# Patient Record
Sex: Female | Born: 1940 | Race: White | Hispanic: No | Smoking: Never smoker
Health system: Southern US, Community
[De-identification: ages and names within clinical notes are randomized; demographics above are authoritative.]

## PROBLEM LIST (undated history)

## (undated) DIAGNOSIS — N6009 Solitary cyst of unspecified breast: Secondary | ICD-10-CM

## (undated) DIAGNOSIS — I10 Essential (primary) hypertension: Secondary | ICD-10-CM

## (undated) DIAGNOSIS — M1990 Unspecified osteoarthritis, unspecified site: Secondary | ICD-10-CM

## (undated) DIAGNOSIS — E7800 Pure hypercholesterolemia, unspecified: Secondary | ICD-10-CM

## (undated) DIAGNOSIS — N879 Dysplasia of cervix uteri, unspecified: Secondary | ICD-10-CM

## (undated) DIAGNOSIS — M159 Polyosteoarthritis, unspecified: Secondary | ICD-10-CM

## (undated) DIAGNOSIS — E079 Disorder of thyroid, unspecified: Secondary | ICD-10-CM

## (undated) DIAGNOSIS — R350 Frequency of micturition: Secondary | ICD-10-CM

## (undated) DIAGNOSIS — E669 Obesity, unspecified: Secondary | ICD-10-CM

## (undated) HISTORY — DX: Disorder of thyroid, unspecified: E07.9

## (undated) HISTORY — DX: Polyosteoarthritis, unspecified: M15.9

## (undated) HISTORY — PX: CATARACT EXTRACTION: SUR2

## (undated) HISTORY — DX: Obesity, unspecified: E66.9

## (undated) HISTORY — DX: Solitary cyst of unspecified breast: N60.09

## (undated) HISTORY — DX: Frequency of micturition: R35.0

## (undated) HISTORY — DX: Pure hypercholesterolemia, unspecified: E78.00

## (undated) HISTORY — DX: Dysplasia of cervix uteri, unspecified: N87.9

---

## 1999-07-24 ENCOUNTER — Other Ambulatory Visit: Admission: RE | Admit: 1999-07-24 | Discharge: 1999-07-24 | Payer: Self-pay | Admitting: Obstetrics and Gynecology

## 2001-12-19 ENCOUNTER — Other Ambulatory Visit: Admission: RE | Admit: 2001-12-19 | Discharge: 2001-12-19 | Payer: Self-pay | Admitting: Obstetrics and Gynecology

## 2004-12-20 ENCOUNTER — Other Ambulatory Visit: Admission: RE | Admit: 2004-12-20 | Discharge: 2004-12-20 | Payer: Self-pay | Admitting: Obstetrics and Gynecology

## 2005-03-02 ENCOUNTER — Ambulatory Visit (HOSPITAL_COMMUNITY): Admission: RE | Admit: 2005-03-02 | Discharge: 2005-03-02 | Payer: Self-pay | Admitting: Gastroenterology

## 2005-03-02 ENCOUNTER — Encounter (INDEPENDENT_AMBULATORY_CARE_PROVIDER_SITE_OTHER): Payer: Self-pay | Admitting: *Deleted

## 2006-05-13 ENCOUNTER — Encounter: Payer: Self-pay | Admitting: Internal Medicine

## 2006-05-13 LAB — CONVERTED CEMR LAB

## 2006-05-22 ENCOUNTER — Other Ambulatory Visit: Admission: RE | Admit: 2006-05-22 | Discharge: 2006-05-22 | Payer: Self-pay | Admitting: Obstetrics and Gynecology

## 2006-07-08 ENCOUNTER — Ambulatory Visit: Payer: Self-pay | Admitting: Internal Medicine

## 2006-07-08 LAB — CONVERTED CEMR LAB
BUN: 8 mg/dL (ref 6–23)
Basophils Absolute: 0 10*3/uL (ref 0.0–0.1)
Calcium: 9.5 mg/dL (ref 8.4–10.5)
Chloride: 105 meq/L (ref 96–112)
Cholesterol: 232 mg/dL (ref 0–200)
Creatinine, Ser: 0.6 mg/dL (ref 0.4–1.2)
Direct LDL: 155.8 mg/dL
Hgb A1c MFr Bld: 10.3 % — ABNORMAL HIGH (ref 4.6–6.0)
MCHC: 34.4 g/dL (ref 30.0–36.0)
Monocytes Relative: 6.7 % (ref 3.0–11.0)
RBC: 4.48 M/uL (ref 3.87–5.11)
RDW: 12.6 % (ref 11.5–14.6)
TSH: 9.47 microintl units/mL — ABNORMAL HIGH (ref 0.35–5.50)
Total CHOL/HDL Ratio: 5.5
Triglycerides: 132 mg/dL (ref 0–149)
VLDL: 26 mg/dL (ref 0–40)

## 2006-08-16 ENCOUNTER — Ambulatory Visit: Payer: Self-pay | Admitting: Internal Medicine

## 2006-08-16 LAB — CONVERTED CEMR LAB
BUN: 11 mg/dL (ref 6–23)
CO2: 29 meq/L (ref 19–32)
Cholesterol: 248 mg/dL (ref 0–200)
Creatinine, Ser: 0.5 mg/dL (ref 0.4–1.2)
Direct LDL: 166.7 mg/dL
HDL: 47.6 mg/dL (ref >39.0)
Potassium: 4 meq/L (ref 3.5–5.1)

## 2006-10-16 ENCOUNTER — Ambulatory Visit: Payer: Self-pay | Admitting: Internal Medicine

## 2006-10-16 LAB — CONVERTED CEMR LAB
Calcium: 9.5 mg/dL (ref 8.4–10.5)
GFR calc Af Amer: 159 mL/min
GFR calc non Af Amer: 132 mL/min
Glucose, Bld: 120 mg/dL — ABNORMAL HIGH (ref 70–99)
Sodium: 143 meq/L (ref 135–145)
Total CHOL/HDL Ratio: 4.4
Triglycerides: 116 mg/dL (ref 0–149)

## 2006-10-28 ENCOUNTER — Ambulatory Visit: Payer: Self-pay | Admitting: Family Medicine

## 2006-10-31 ENCOUNTER — Ambulatory Visit: Payer: Self-pay | Admitting: Internal Medicine

## 2006-11-11 ENCOUNTER — Encounter: Admission: RE | Admit: 2006-11-11 | Discharge: 2006-11-20 | Payer: Self-pay | Admitting: Internal Medicine

## 2007-01-29 ENCOUNTER — Encounter: Payer: Self-pay | Admitting: Internal Medicine

## 2007-01-29 DIAGNOSIS — Z860100 Personal history of colon polyps, unspecified: Secondary | ICD-10-CM | POA: Insufficient documentation

## 2007-01-29 DIAGNOSIS — I10 Essential (primary) hypertension: Secondary | ICD-10-CM

## 2007-01-29 DIAGNOSIS — E119 Type 2 diabetes mellitus without complications: Secondary | ICD-10-CM | POA: Insufficient documentation

## 2007-01-29 DIAGNOSIS — E785 Hyperlipidemia, unspecified: Secondary | ICD-10-CM

## 2007-01-29 DIAGNOSIS — Z8719 Personal history of other diseases of the digestive system: Secondary | ICD-10-CM | POA: Insufficient documentation

## 2007-05-29 ENCOUNTER — Other Ambulatory Visit: Admission: RE | Admit: 2007-05-29 | Discharge: 2007-05-29 | Payer: Self-pay | Admitting: Obstetrics and Gynecology

## 2007-09-16 ENCOUNTER — Emergency Department (HOSPITAL_COMMUNITY): Admission: EM | Admit: 2007-09-16 | Discharge: 2007-09-16 | Payer: Self-pay | Admitting: Family Medicine

## 2007-09-23 ENCOUNTER — Emergency Department (HOSPITAL_COMMUNITY): Admission: EM | Admit: 2007-09-23 | Discharge: 2007-09-23 | Payer: Self-pay | Admitting: Family Medicine

## 2007-09-26 ENCOUNTER — Emergency Department (HOSPITAL_COMMUNITY): Admission: EM | Admit: 2007-09-26 | Discharge: 2007-09-26 | Payer: Self-pay | Admitting: Emergency Medicine

## 2008-05-31 ENCOUNTER — Ambulatory Visit: Payer: Self-pay | Admitting: Obstetrics and Gynecology

## 2008-05-31 ENCOUNTER — Other Ambulatory Visit: Admission: RE | Admit: 2008-05-31 | Discharge: 2008-05-31 | Payer: Self-pay | Admitting: Obstetrics and Gynecology

## 2008-11-18 ENCOUNTER — Emergency Department (HOSPITAL_COMMUNITY): Admission: EM | Admit: 2008-11-18 | Discharge: 2008-11-18 | Payer: Self-pay | Admitting: Emergency Medicine

## 2009-01-04 ENCOUNTER — Ambulatory Visit: Payer: Self-pay | Admitting: Obstetrics and Gynecology

## 2009-06-08 ENCOUNTER — Other Ambulatory Visit: Admission: RE | Admit: 2009-06-08 | Discharge: 2009-06-08 | Payer: Self-pay | Admitting: Obstetrics and Gynecology

## 2009-06-08 ENCOUNTER — Ambulatory Visit: Payer: Self-pay | Admitting: Obstetrics and Gynecology

## 2009-06-23 ENCOUNTER — Ambulatory Visit: Payer: Self-pay | Admitting: Obstetrics and Gynecology

## 2010-06-19 ENCOUNTER — Ambulatory Visit
Admission: RE | Admit: 2010-06-19 | Discharge: 2010-06-19 | Payer: Self-pay | Source: Home / Self Care | Attending: Obstetrics and Gynecology | Admitting: Obstetrics and Gynecology

## 2010-07-19 ENCOUNTER — Ambulatory Visit (INDEPENDENT_AMBULATORY_CARE_PROVIDER_SITE_OTHER): Payer: Medicare Other

## 2010-07-19 ENCOUNTER — Inpatient Hospital Stay (INDEPENDENT_AMBULATORY_CARE_PROVIDER_SITE_OTHER)
Admission: RE | Admit: 2010-07-19 | Discharge: 2010-07-19 | Disposition: A | Discharge status: Home / Self Care | Payer: Medicare Other | Source: Ambulatory Visit | Attending: Family Medicine | Admitting: Family Medicine

## 2010-07-19 DIAGNOSIS — J40 Bronchitis, not specified as acute or chronic: Secondary | ICD-10-CM

## 2010-09-18 LAB — WET PREP, GENITAL
Clue Cells Wet Prep HPF POC: NONE SEEN
Trich, Wet Prep: NONE SEEN
Yeast Wet Prep HPF POC: NONE SEEN

## 2010-09-18 LAB — POCT URINALYSIS DIP (DEVICE)
Hgb urine dipstick: NEGATIVE
Protein, ur: NEGATIVE mg/dL
Specific Gravity, Urine: <=1.005 (ref 1.005–1.030)
Urobilinogen, UA: 0.2 mg/dL (ref 0.0–1.0)
pH: 6.5 (ref 5.0–8.0)

## 2010-10-27 NOTE — Op Note (Signed)
NAMEMARGIA, WIESEN NO.:  [id number]   MEDICAL RECORD NO.:  [id number]          PATIENT TYPE:  AMB   LOCATION:  ENDO                         FACILITY:  [facility]   PHYSICIAN:  Graylin Shiver, M.D.   DATE OF BIRTH:  06/30/1940   DATE OF PROCEDURE:  03/02/2005  DATE OF DISCHARGE:                                 OPERATIVE REPORT   PROCEDURE:  Colonoscopy with polypectomy.   ENDOSCOPIST:  Graylin Shiver, M.D.   INDICATION:  Heme-positive stool.   Informed consent was obtained after explanation of the risks of bleeding,  infection and perforation.   PREMEDICATION:  Fentanyl 75 mcg IV, Versed 6 mg IV.   PROCEDURE:  With the patient in the left lateral decubitus position, a  rectal exam was performed; no masses were felt.  The Olympus colonoscope was  inserted into the rectum and advanced around colon to the cecum.  Cecal  landmarks were identified with identification of the appendiceal orifice and  ileocecal valve.  In the cecum, there was a 5-mm sessile polyp; this was  snared with a mini-snare and removed by snare cautery technique.  It was  suctioned through the scope into the specimen container.  The ascending  colon looked normal.  The transverse colon showed few scattered diverticula.  The descending colon and sigmoid colon showed diverticulosis.  The rectum  looked normal.  The prep was good.  She tolerated the procedure well without  complications.   IMPRESSION:  1.  Cecal polyp.  2.  Diverticulosis.   PLAN:  The pathology will be checked.           ______________________________  Graylin Shiver, M.D.     SFG/MEDQ  D:  03/02/2005  T:  03/03/2005  Job:  [id number]   cc:   Reuel Boom L. Eda Paschal, M.D.  Fax: [phone]

## 2011-01-27 ENCOUNTER — Inpatient Hospital Stay (INDEPENDENT_AMBULATORY_CARE_PROVIDER_SITE_OTHER)
Admission: RE | Admit: 2011-01-27 | Discharge: 2011-01-27 | Disposition: A | Discharge status: Home / Self Care | Payer: Medicare Other | Source: Ambulatory Visit | Attending: Family Medicine | Admitting: Family Medicine

## 2011-01-27 DIAGNOSIS — IMO0002 Reserved for concepts with insufficient information to code with codable children: Secondary | ICD-10-CM

## 2011-01-27 LAB — POCT URINALYSIS DIP (DEVICE)
Bilirubin Urine: NEGATIVE
Glucose, UA: NEGATIVE mg/dL
Nitrite: NEGATIVE

## 2011-01-30 LAB — URINE CULTURE: Culture  Setup Time: 201208191126

## 2011-05-17 ENCOUNTER — Other Ambulatory Visit: Payer: Self-pay | Admitting: *Deleted

## 2011-05-17 DIAGNOSIS — N63 Unspecified lump in unspecified breast: Secondary | ICD-10-CM

## 2011-07-02 ENCOUNTER — Other Ambulatory Visit: Payer: Self-pay | Admitting: Obstetrics and Gynecology

## 2011-07-02 DIAGNOSIS — N63 Unspecified lump in unspecified breast: Secondary | ICD-10-CM

## 2011-08-07 ENCOUNTER — Encounter: Payer: Self-pay | Admitting: Obstetrics and Gynecology

## 2011-08-07 ENCOUNTER — Ambulatory Visit (INDEPENDENT_AMBULATORY_CARE_PROVIDER_SITE_OTHER): Payer: Medicare Other | Admitting: Obstetrics and Gynecology

## 2011-08-07 ENCOUNTER — Other Ambulatory Visit (HOSPITAL_COMMUNITY)
Admission: RE | Admit: 2011-08-07 | Discharge: 2011-08-07 | Disposition: A | Discharge status: Home / Self Care | Payer: Medicare Other | Source: Ambulatory Visit | Attending: Obstetrics and Gynecology | Admitting: Obstetrics and Gynecology

## 2011-08-07 VITALS — BP 140/80 | Ht 64.5 in | Wt 205.0 lb

## 2011-08-07 DIAGNOSIS — N952 Postmenopausal atrophic vaginitis: Secondary | ICD-10-CM

## 2011-08-07 DIAGNOSIS — Z124 Encounter for screening for malignant neoplasm of cervix: Secondary | ICD-10-CM

## 2011-08-07 DIAGNOSIS — E7800 Pure hypercholesterolemia, unspecified: Secondary | ICD-10-CM | POA: Insufficient documentation

## 2011-08-07 DIAGNOSIS — N393 Stress incontinence (female) (male): Secondary | ICD-10-CM

## 2011-08-07 DIAGNOSIS — E079 Disorder of thyroid, unspecified: Secondary | ICD-10-CM | POA: Insufficient documentation

## 2011-08-07 DIAGNOSIS — N390 Urinary tract infection, site not specified: Secondary | ICD-10-CM

## 2011-08-07 DIAGNOSIS — N6009 Solitary cyst of unspecified breast: Secondary | ICD-10-CM | POA: Insufficient documentation

## 2011-08-07 DIAGNOSIS — R350 Frequency of micturition: Secondary | ICD-10-CM | POA: Insufficient documentation

## 2011-08-07 NOTE — Progress Notes (Signed)
The patient came back to see me today for further followup. We have placed her on vaginal estrogen for atrophic vaginitis. She is less dry now. Since she has been on it her urinary stress incontinence has resolved. In addition she is doing Kegel exercises. She is having no vaginal bleeding. She is having no pelvic pain. She has however had 3 UTIs. She is not sexually active. The breast cyst found on mammogram one year ago is now resolved on recent mammogram. Mammogram was otherwise normal. She is currently having no dysuria. She will have occasional nocturia. Patient has had 2 normal bone densities.  ROS: 12 system review done. Pertinent positives above. Other positives include diabetes, hypertension, hyperlipidemia, and diverticulitis.  Physical examination: Kennon Portela present. HEENT within normal limits. Neck: Thyroid not large. No masses. Supraclavicular nodes: not enlarged. Breasts: Examined in both sitting midline position. No skin changes and no masses. Abdomen: Soft no guarding rebound or masses or hernia. Pelvic: External: Within normal limits. BUS: Within normal limits. Vaginal:within normal limits. Good estrogen effect. No evidence of cystocele rectocele or enterocele. Cervix: clean. Uterus: Normal size and shape. Adnexa: No masses. Rectovaginal exam: Confirmatory and negative. Extremities: Within normal limits.  Assessment: #1. Atrophic vaginitis #2. Urinary tract infections #3. Breast cyst #4. Urinary stress incontinence  Plan: Continue yearly mammograms. urological referral if UTIs continue. Continue estradiol vaginal cream to 0.02%.

## 2014-03-18 LAB — CBC AND DIFFERENTIAL
HCT: 37 % (ref 36–46)
Hemoglobin: 12.1 g/dL (ref 12.0–16.0)
Platelets: 264 10*3/uL (ref 150–399)
WBC: 5.7 10*3/mL

## 2014-04-12 ENCOUNTER — Encounter: Payer: Self-pay | Admitting: Obstetrics and Gynecology

## 2014-05-04 ENCOUNTER — Ambulatory Visit
Admission: RE | Admit: 2014-05-04 | Discharge: 2014-05-04 | Disposition: A | Discharge status: Home / Self Care | Payer: Medicare Other | Source: Ambulatory Visit | Attending: Family Medicine | Admitting: Family Medicine

## 2014-05-04 ENCOUNTER — Other Ambulatory Visit: Payer: Self-pay | Admitting: Family Medicine

## 2014-05-04 DIAGNOSIS — M25561 Pain in right knee: Secondary | ICD-10-CM

## 2014-05-04 DIAGNOSIS — R52 Pain, unspecified: Secondary | ICD-10-CM

## 2014-07-07 ENCOUNTER — Encounter (INDEPENDENT_AMBULATORY_CARE_PROVIDER_SITE_OTHER): Payer: Self-pay | Admitting: Ophthalmology

## 2015-03-08 LAB — BASIC METABOLIC PANEL
BUN: 12 mg/dL (ref 4–21)
Creatinine: 0.6 mg/dL (ref <=1.1)
GLUCOSE: 144 mg/dL
Potassium: 4.5 mmol/L (ref 3.4–5.3)
Sodium: 138 mmol/L (ref 137–147)

## 2015-03-08 LAB — HEMOGLOBIN A1C: HEMOGLOBIN A1C: 7.7

## 2015-03-08 LAB — LIPID PANEL
Cholesterol: 203 mg/dL — AB (ref 0–200)
HDL: 44 mg/dL (ref 35–70)
LDL CALC: 123 mg/dL
TRIGLYCERIDES: 179 mg/dL — AB (ref 40–160)

## 2015-03-08 LAB — TSH: TSH: 0.29 u[IU]/mL — AB (ref <=5.90)

## 2015-06-06 ENCOUNTER — Emergency Department (HOSPITAL_BASED_OUTPATIENT_CLINIC_OR_DEPARTMENT_OTHER)
Admission: EM | Admit: 2015-06-06 | Discharge: 2015-06-07 | Disposition: A | Discharge status: Home / Self Care | Payer: Medicare Other | Attending: Emergency Medicine | Admitting: Emergency Medicine

## 2015-06-06 ENCOUNTER — Encounter (HOSPITAL_BASED_OUTPATIENT_CLINIC_OR_DEPARTMENT_OTHER): Payer: Self-pay | Admitting: *Deleted

## 2015-06-06 DIAGNOSIS — I10 Essential (primary) hypertension: Secondary | ICD-10-CM | POA: Insufficient documentation

## 2015-06-06 DIAGNOSIS — R739 Hyperglycemia, unspecified: Secondary | ICD-10-CM

## 2015-06-06 DIAGNOSIS — E079 Disorder of thyroid, unspecified: Secondary | ICD-10-CM | POA: Insufficient documentation

## 2015-06-06 DIAGNOSIS — E7800 Pure hypercholesterolemia, unspecified: Secondary | ICD-10-CM | POA: Insufficient documentation

## 2015-06-06 DIAGNOSIS — Z87448 Personal history of other diseases of urinary system: Secondary | ICD-10-CM | POA: Diagnosis not present

## 2015-06-06 DIAGNOSIS — Z8742 Personal history of other diseases of the female genital tract: Secondary | ICD-10-CM | POA: Insufficient documentation

## 2015-06-06 DIAGNOSIS — E1165 Type 2 diabetes mellitus with hyperglycemia: Secondary | ICD-10-CM | POA: Diagnosis present

## 2015-06-06 DIAGNOSIS — Z8739 Personal history of other diseases of the musculoskeletal system and connective tissue: Secondary | ICD-10-CM | POA: Diagnosis not present

## 2015-06-06 DIAGNOSIS — Z79899 Other long term (current) drug therapy: Secondary | ICD-10-CM | POA: Insufficient documentation

## 2015-06-06 HISTORY — DX: Essential (primary) hypertension: I10

## 2015-06-06 HISTORY — DX: Unspecified osteoarthritis, unspecified site: M19.90

## 2015-06-06 LAB — COMPREHENSIVE METABOLIC PANEL
ALBUMIN: 4 g/dL (ref 3.5–5.0)
ALT: 22 U/L (ref 14–54)
AST: 21 U/L (ref 15–41)
Alkaline Phosphatase: 64 U/L (ref 38–126)
Anion gap: 7 (ref 5–15)
BUN: 17 mg/dL (ref 6–20)
CHLORIDE: 102 mmol/L (ref 101–111)
CO2: 25 mmol/L (ref 22–32)
CREATININE: 0.61 mg/dL (ref 0.44–1.00)
Calcium: 9.4 mg/dL (ref 8.9–10.3)
GFR calc Af Amer: >60 mL/min (ref >60)
GFR calc non Af Amer: >60 mL/min (ref >60)
GLUCOSE: 290 mg/dL — AB (ref 65–99)
Potassium: 4 mmol/L (ref 3.5–5.1)
SODIUM: 134 mmol/L — AB (ref 135–145)
Total Bilirubin: 0.4 mg/dL (ref 0.3–1.2)
Total Protein: 7.1 g/dL (ref 6.5–8.1)

## 2015-06-06 LAB — CBC
HCT: 35.9 % — ABNORMAL LOW (ref 36.0–46.0)
Hemoglobin: 11.9 g/dL — ABNORMAL LOW (ref 12.0–15.0)
MCH: 29 pg (ref 26.0–34.0)
MCHC: 33.1 g/dL (ref 30.0–36.0)
MCV: 87.3 fL (ref 78.0–100.0)
PLATELETS: 277 10*3/uL (ref 150–400)
RBC: 4.11 MIL/uL (ref 3.87–5.11)
RDW: 14 % (ref 11.5–15.5)
WBC: 10.6 10*3/uL — ABNORMAL HIGH (ref 4.0–10.5)

## 2015-06-06 LAB — CBG MONITORING, ED: Glucose-Capillary: 315 mg/dL — ABNORMAL HIGH (ref 65–99)

## 2015-06-06 LAB — URINALYSIS, ROUTINE W REFLEX MICROSCOPIC
BILIRUBIN URINE: NEGATIVE
Glucose, UA: >1000 mg/dL — AB
HGB URINE DIPSTICK: NEGATIVE
Ketones, ur: NEGATIVE mg/dL
Leukocytes, UA: NEGATIVE
Nitrite: NEGATIVE
PH: 6.5 (ref 5.0–8.0)
Protein, ur: NEGATIVE mg/dL
SPECIFIC GRAVITY, URINE: 1.03 (ref 1.005–1.030)

## 2015-06-06 LAB — URINE MICROSCOPIC-ADD ON: RBC / HPF: NONE SEEN RBC/hpf (ref 0–5)

## 2015-06-06 MED ORDER — INSULIN ASPART 100 UNIT/ML ~~LOC~~ SOLN
4.0000 [IU] | Freq: Once | SUBCUTANEOUS | Status: AC
  Administered 2015-06-06: 4 [IU] via SUBCUTANEOUS
  Filled 2015-06-06: qty 1

## 2015-06-06 MED ORDER — SODIUM CHLORIDE 0.9 % IV BOLUS (SEPSIS)
1000.0000 mL | Freq: Once | INTRAVENOUS | Status: AC
  Administered 2015-06-06: 1000 mL via INTRAVENOUS

## 2015-06-06 NOTE — ED Provider Notes (Signed)
CSN: [id number]     Arrival date & time 06/06/15  2032 History   First MD Initiated Contact with Patient 06/06/15 2232     Chief Complaint  Patient presents with  . Hyperglycemia     (Consider location/radiation/quality/duration/timing/severity/associated sxs/prior Treatment) HPI   Patient is a 74 year old female with past medical history of diabetes and hypothyroidism who presents to the ED with complaint of hyperglycemia. Patient states she has been on steroids for her shoulder and sore throat. She notes she was on a 5 day dose that was started on 12/15 however due to her shoulder pain persisting her doctor started her on another steroid dose on 12/22. She notes since starting the second dose of steroids she began having lightheadedness upon standing and notes her blood sugars have been running in the upper 300s at home. She states her blood sugars typically are in the 100s. Denies fever, chills, headache, visual changes, dizziness, cough, difficulty breathing, chest pain, abdominal pain, nausea, vomiting, diarrhea, urinary symptoms, numbness, tingling, weakness, syncope.  Past Medical History  Diagnosis Date  . Diabetes mellitus   . Cervical intraepithelial neoplasia (CIN)     CRYO  . Elevated cholesterol   . Breast cyst   . Frequent urination   . Thyroid disease     HYPOTHYROID  . Hypertension   . Arthritis    Past Surgical History  Procedure Laterality Date  . Cataract extraction     Family History  Problem Relation Age of Onset  . Hypertension Mother   . Heart disease Mother   . Diabetes Mother   . Hypertension Sister   . Hypertension Brother   . Heart disease Brother   . Diabetes Maternal Grandmother   . Heart disease Maternal Grandmother    Social History  Substance Use Topics  . Smoking status: Never Smoker   . Smokeless tobacco: Never Used  . Alcohol Use: No   OB History    Gravida Para Term Preterm AB TAB SAB Ectopic Multiple Living   0     2      Review of Systems  Neurological: Positive for light-headedness.  All other systems reviewed and are negative.     Allergies  Metformin  Home Medications   Prior to Admission medications   Medication Sig Start Date End Date Taking? Authorizing Provider  Alpha-Lipoic Acid 200 MG CAPS Take 400 mg by mouth 2 (two) times daily.   Yes Historical Provider, MD  ARMOUR THYROID PO Take 100 mcg by mouth.   Yes Historical Provider, MD  CALCIUM PO Take by mouth.   Yes Historical Provider, MD  cholecalciferol (VITAMIN D) 1000 UNITS tablet Take 5,000 Units by mouth daily.   Yes Historical Provider, MD  Chromium 500 MCG TABS Take by mouth.   Yes Historical Provider, MD  Cyanocobalamin (VITAMIN B 12 PO) Take by mouth daily.   Yes Historical Provider, MD  ESTRADIOL VA Place vaginally.   Yes Historical Provider, MD  glimepiride (AMARYL) 2 MG tablet Take 2 mg by mouth 2 (two) times daily.   Yes Historical Provider, MD  Omega-3 Fatty Acids (OMEGA 3 PO) Take by mouth.   Yes Historical Provider, MD  vitamin C (ASCORBIC ACID) 500 MG tablet Take 500 mg by mouth daily.   Yes Historical Provider, MD  vitamin E 400 UNIT capsule Take 400 Units by mouth daily.   Yes Historical Provider, MD  Zinc Citrate-Phytase 25-500 MG CAPS Take 50-1,000 mg by mouth daily.  Yes Historical Provider, MD  simvastatin (ZOCOR) 20 MG tablet Take 20 mg by mouth every evening.    Historical Provider, MD   BP 177/76 mmHg  Pulse 70  Temp(Src) 98.4 F (36.9 C) (Oral)  Resp 18  SpO2 98% Physical Exam  Constitutional: She is oriented to person, place, and time. She appears well-developed and well-nourished. No distress.  HENT:  Head: Normocephalic and atraumatic.  Mouth/Throat: Oropharynx is clear and moist. No oropharyngeal exudate.  Eyes: Conjunctivae and EOM are normal. Pupils are equal, round, and reactive to light. Right eye exhibits no discharge. Left eye exhibits no discharge. No scleral icterus.  Neck: Normal range of  motion. Neck supple.  Cardiovascular: Normal rate, regular rhythm, normal heart sounds and intact distal pulses.   Pulmonary/Chest: Effort normal and breath sounds normal. No respiratory distress. She has no wheezes. She has no rales. She exhibits no tenderness.  Abdominal: Soft. Bowel sounds are normal. She exhibits no distension and no mass. There is no tenderness. There is no rebound and no guarding.  Musculoskeletal: She exhibits no edema.  Lymphadenopathy:    She has no cervical adenopathy.  Neurological: She is alert and oriented to person, place, and time. She has normal strength. No cranial nerve deficit or sensory deficit. Coordination and gait normal.  Skin: Skin is warm and dry.  Nursing note and vitals reviewed.   ED Course  Procedures (including critical care time) Labs Review Labs Reviewed  URINALYSIS, ROUTINE W REFLEX MICROSCOPIC (NOT AT [facility]) - Abnormal; Notable for the following:    Glucose, UA >1000 (*)    All other components within normal limits  URINE MICROSCOPIC-ADD ON - Abnormal; Notable for the following:    Squamous Epithelial / LPF 0-5 (*)    Bacteria, UA FEW (*)    All other components within normal limits  COMPREHENSIVE METABOLIC PANEL - Abnormal; Notable for the following:    Sodium 134 (*)    Glucose, Bld 290 (*)    All other components within normal limits  CBC - Abnormal; Notable for the following:    WBC 10.6 (*)    Hemoglobin 11.9 (*)    HCT 35.9 (*)    All other components within normal limits  CBG MONITORING, ED - Abnormal; Notable for the following:    Glucose-Capillary 315 (*)    All other components within normal limits  CBG MONITORING, ED - Abnormal; Notable for the following:    Glucose-Capillary 204 (*)    All other components within normal limits  CBG MONITORING, ED    Imaging Review No results found. I have personally reviewed and evaluated these images and lab results as part of my medical decision-making.  Filed Vitals:    06/06/15 2041 06/06/15 2340  BP: 180/72 177/76  Pulse: 72 70  Temp: 98.4 F (36.9 C)   Resp: 18 18     MDM   Final diagnoses:  Hyperglycemia    Patient presents with hyperglycemia. She reports to recently starting a second dose of steroids on 12/22. Patient takes oral diabetic meds. VSS. Exam unremarkable, no neuro deficits. CBG 315, no anion gap, remaining labs and urine unremarkable. Patient given IV fluids and 4 units of NovoLog. Patient states her symptoms have improved while in the emergency department. Plan to discharge patient home advised her to follow up with her PCP in 2-3 days. Advised patient to stop taking her steroids.  Evaluation does not show pathology requring ongoing emergent intervention or admission. Pt is hemodynamically stable  and mentating appropriately. Discussed findings/results and plan with patient/guardian, who agrees with plan. All questions answered. Return precautions discussed and outpatient follow up given.      Satira Sark [place], [place] 06/07/15 0106  Geoffery Lyons, MD 06/07/15 [phone]

## 2015-06-06 NOTE — ED Notes (Signed)
Pt c/o increased blood sugars since starting a second round of steroids and is concerned that they are running too high.  She c/o increased thirst, is not having increased urination or abdominal pain or vomiting.  She c/o generalized weakness.

## 2015-06-06 NOTE — ED Notes (Signed)
Seen by PCP on Thursday 12/22 for sore throat and shoulder pain- Had been given prednisone on 12/15 and was given another round of prednisone on 12/22- c/o feeling dizzy and dehydrated and blood sugars have been running higher than normal

## 2015-06-07 LAB — CBG MONITORING, ED: Glucose-Capillary: 204 mg/dL — ABNORMAL HIGH (ref 65–99)

## 2015-06-07 NOTE — Discharge Instructions (Signed)
Continue taking her medications as prescribed. Follow-up with your primary care doctor in the next 2-3 days. Emergency department if symptoms worsen or new onset of fever, headache, chest pain, difficulty breathing, vomiting, abdominal pain.

## 2015-06-07 NOTE — ED Notes (Signed)
Pt verbalizes understanding of d/c instructions and denies any further needs at this time. 

## 2015-07-05 IMAGING — CR DG KNEE 1-2V*L*
2 series · 2 of 2 positions shown · non-contrast
Comparison: None.

CLINICAL DATA: Chronic bilateral knee pain ; no history of trauma

EXAM:
LEFT KNEE - 1-2 VIEW

[t knee ap left]
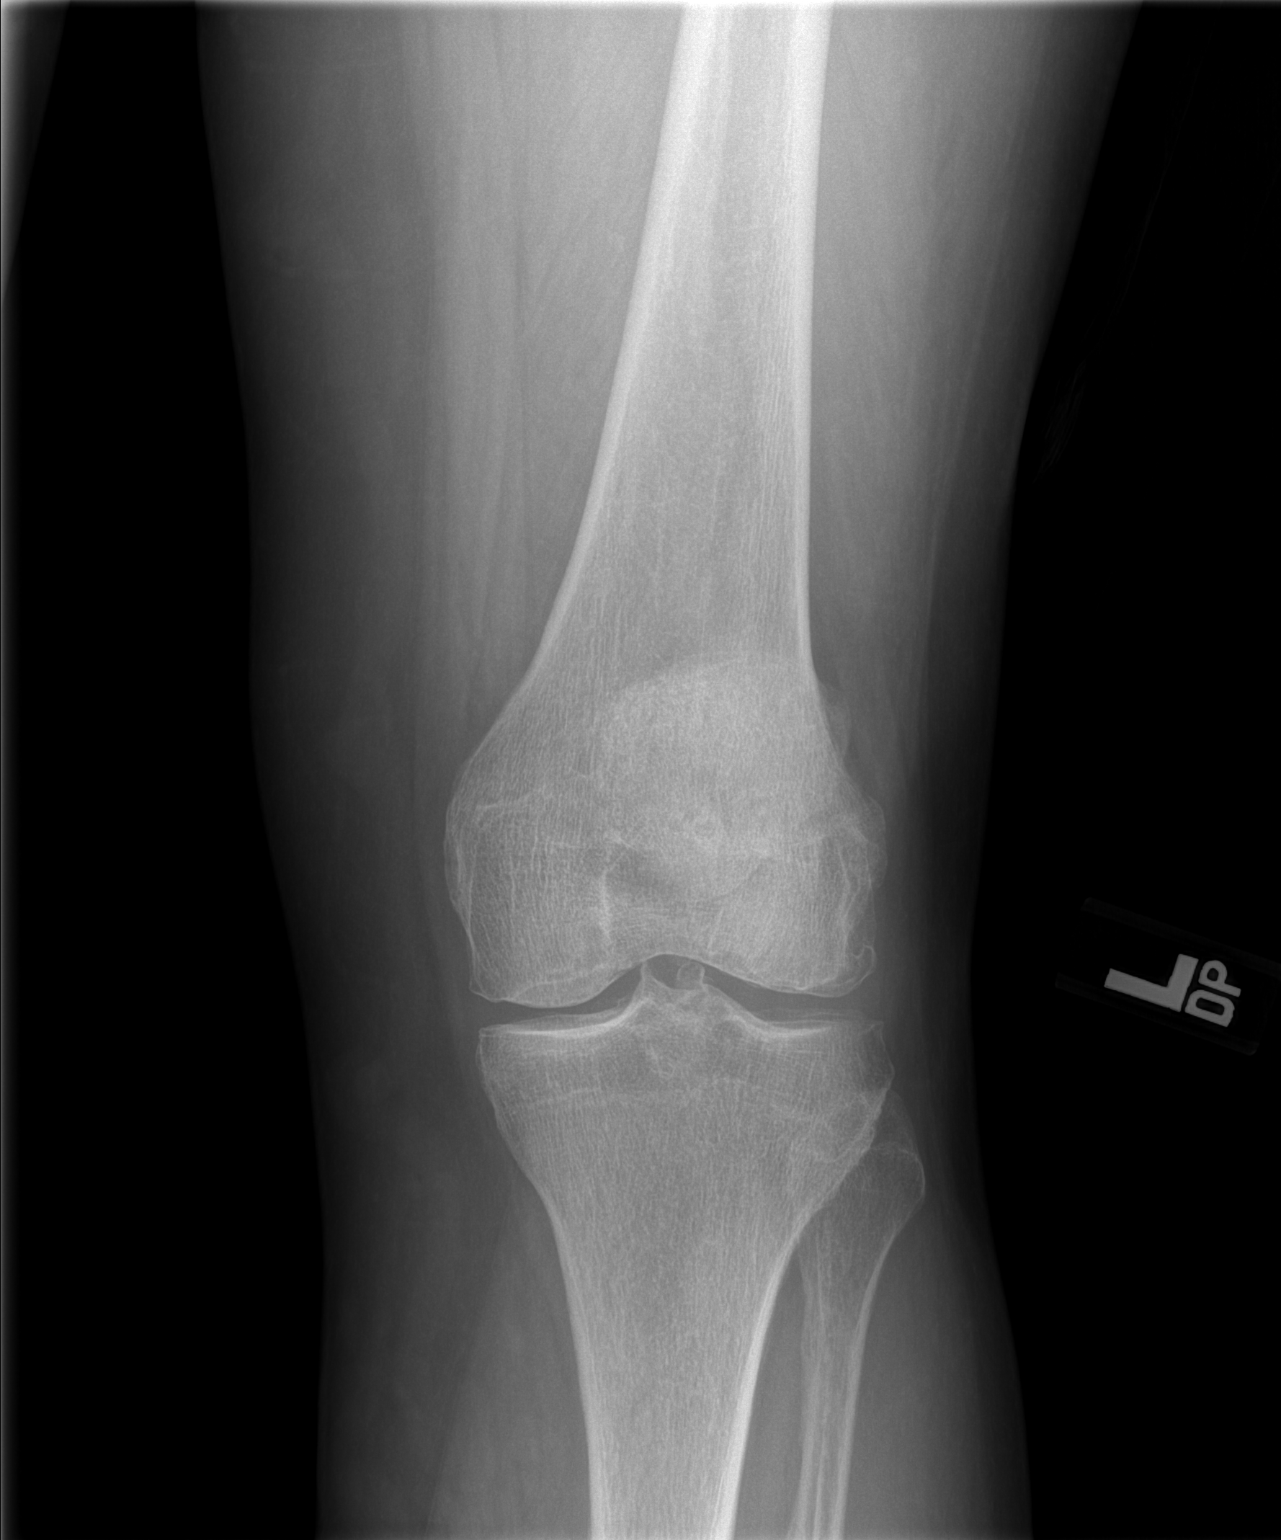

[t knee lat left]
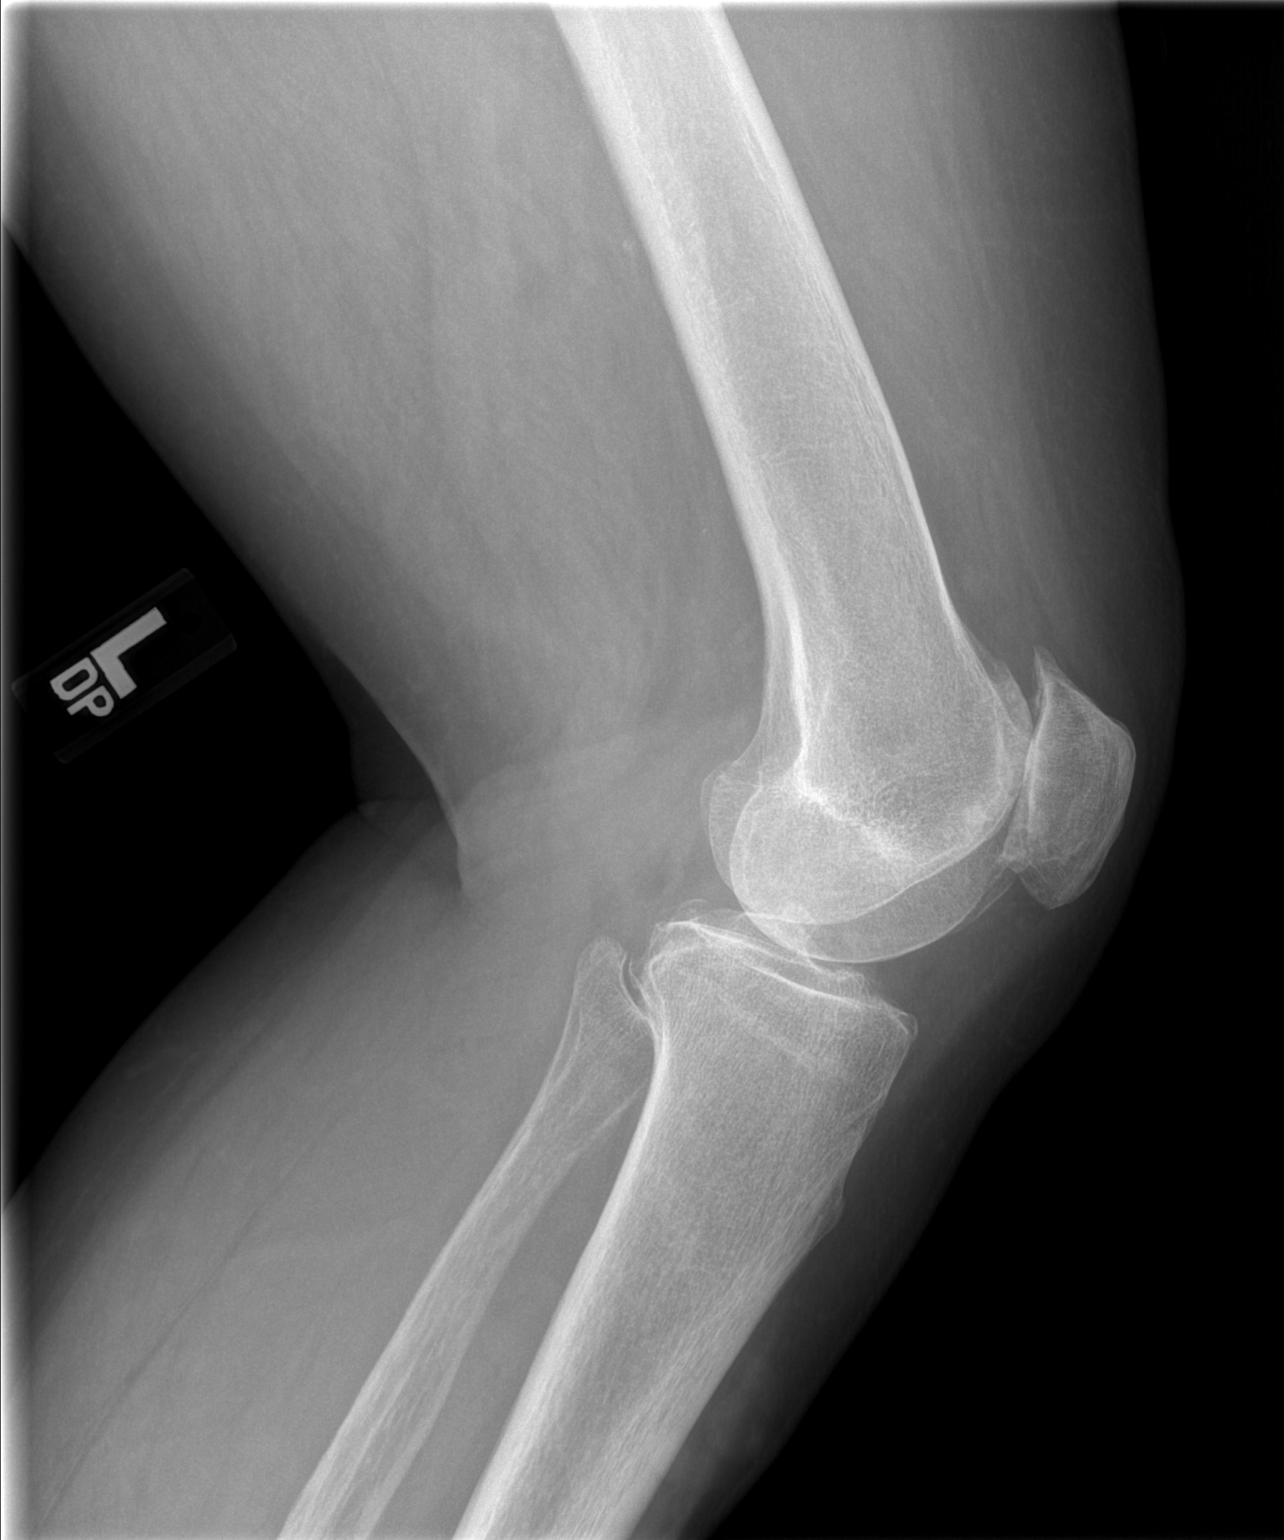

[2 of 2 positions shown; findings below may reference images not displayed]

FINDINGS: The bones of the left knee are mildly osteopenic. There is beaking
of the tibial spines. The joint spaces are reasonably well
maintained. There are marginal spurs from the periphery of the
lateral femoral and lateral tibial condylar surfaces. There are
prominent osteophytes from the superior and inferior articular
margins of the patella. There is no joint effusion. There is no
acute or old fracture nor evidence of dislocation.
IMPRESSION: There is mild tricompartmental degenerative change of the left knee.

## 2015-07-05 IMAGING — CR DG KNEE 1-2V*R*
2 series · 2 of 2 positions shown · non-contrast
Comparison: None.

CLINICAL DATA: Chronic bilateral knee pain without history of
trauma

EXAM:
RIGHT KNEE - 1-2 VIEW

[t knee ap right]
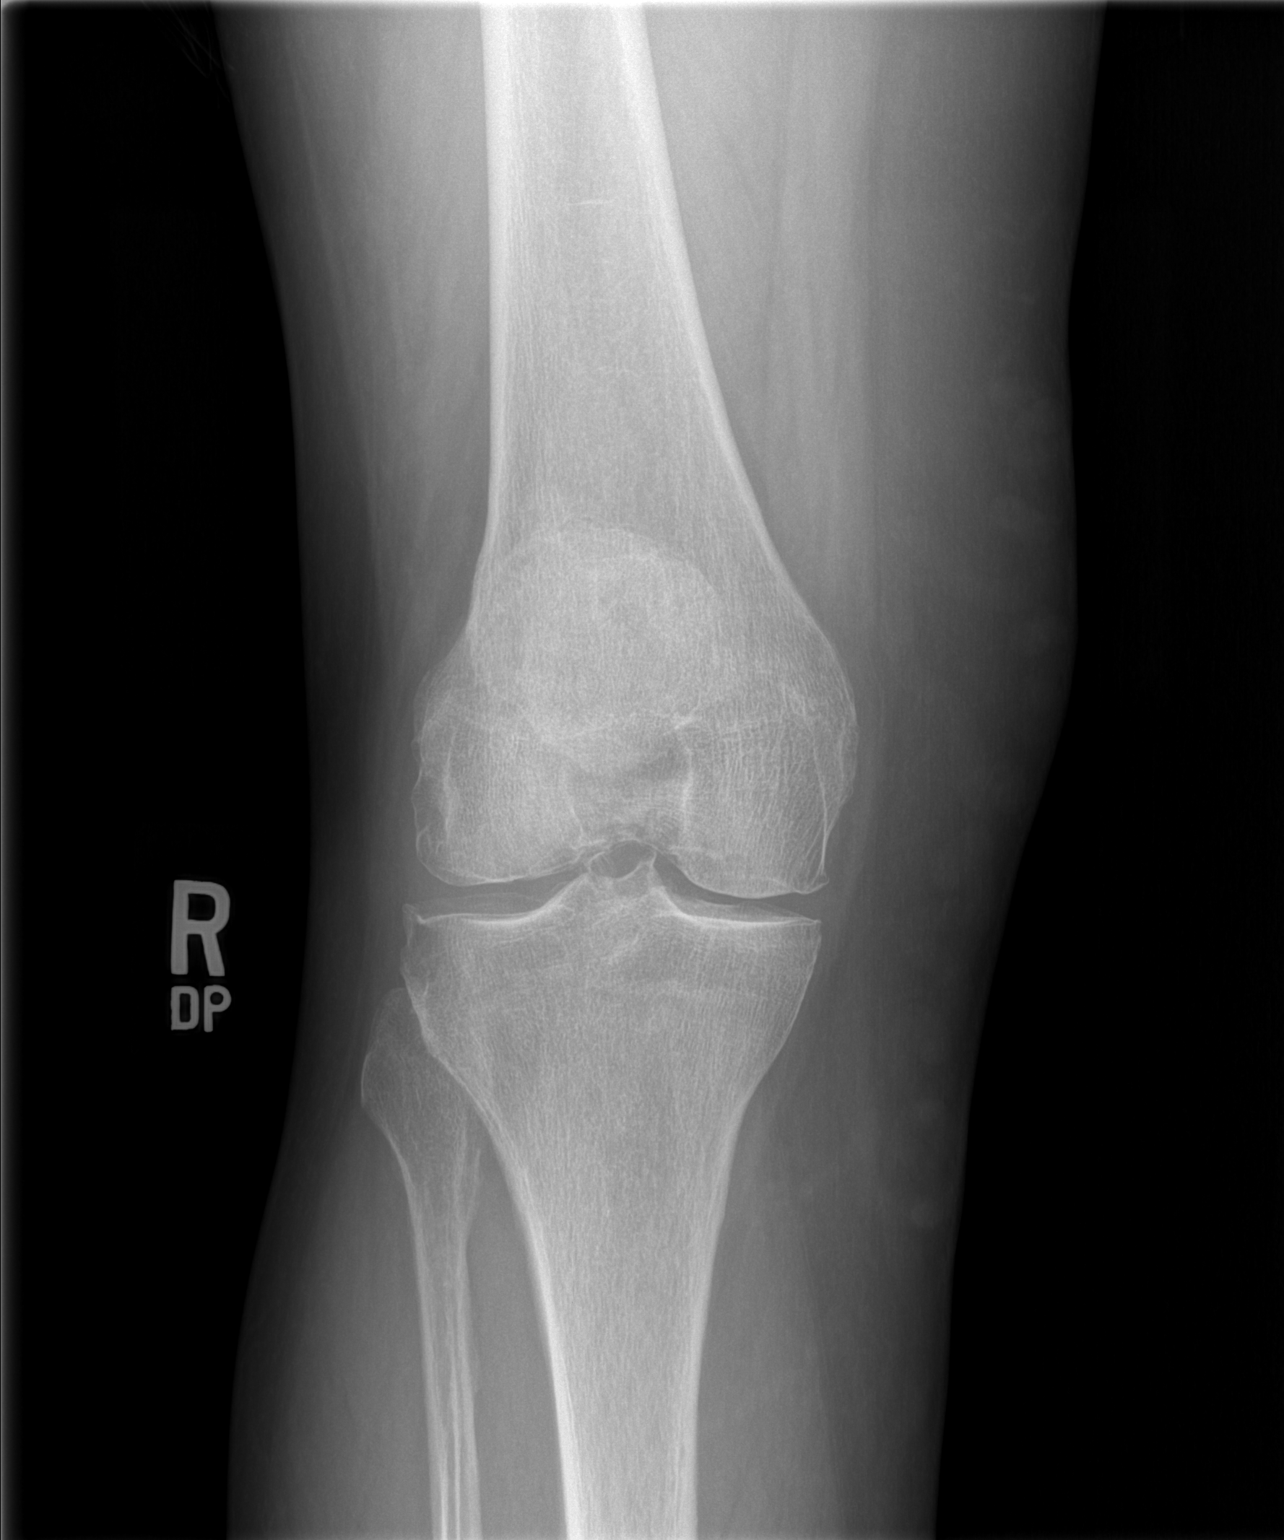

[t knee lat right]
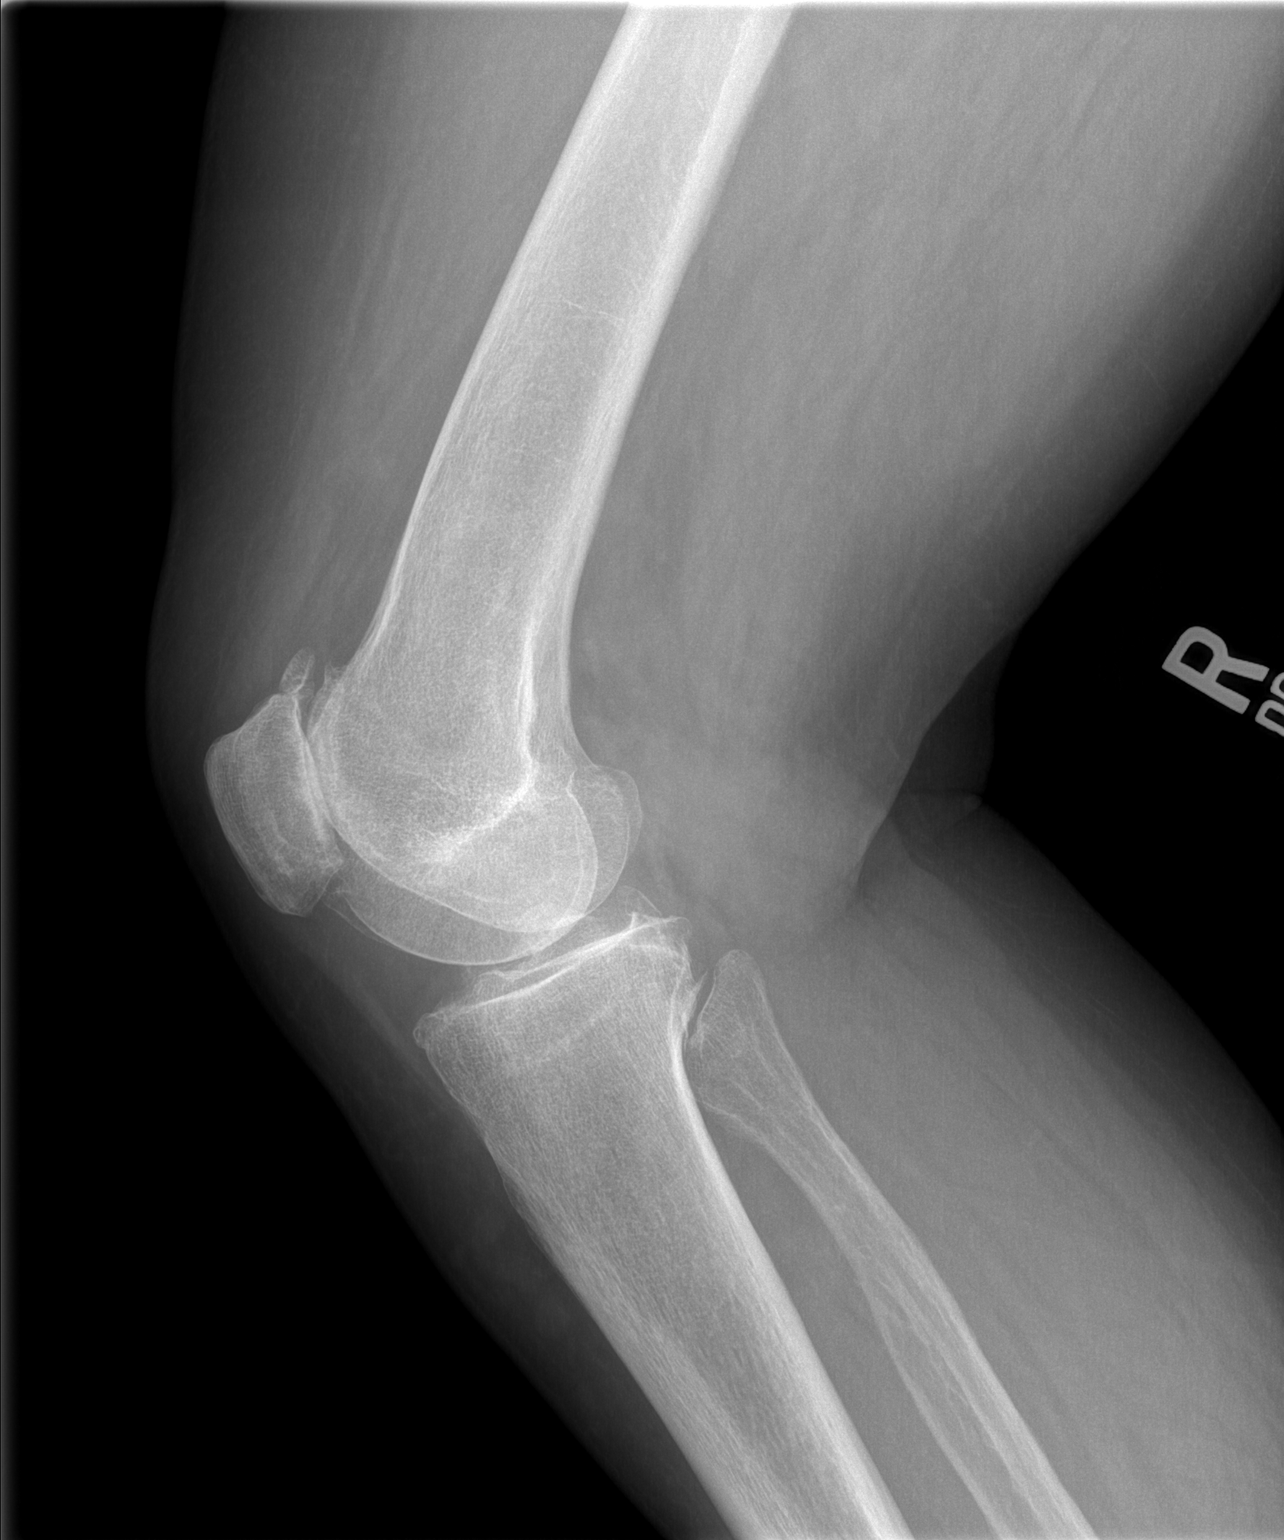

[2 of 2 positions shown; findings below may reference images not displayed]

FINDINGS: The bones are mildly osteopenic. There is beaking of the tibial
spines. There is a small spur from the periphery of the lateral
tibial plateau. There is a small spur from the periphery of the
medial femoral condyle. There is spurring from the superior
articular margin of the patella. There is no joint effusion. There
is no acute or old fracture.
IMPRESSION: There is mild degenerative change involving all 3 joint compartments
of the right knee. There is no acute bony abnormality.

## 2015-07-06 ENCOUNTER — Other Ambulatory Visit: Payer: Self-pay | Admitting: Gastroenterology

## 2015-07-06 LAB — HM COLONOSCOPY

## 2015-08-23 LAB — BASIC METABOLIC PANEL
BUN: 12 mg/dL (ref 4–21)
Creatinine: 0.6 mg/dL (ref <=1.1)
POTASSIUM: 4.2 mmol/L (ref 3.4–5.3)
SODIUM: 136 mmol/L — AB (ref 137–147)

## 2015-08-23 LAB — TSH: TSH: 7.69 u[IU]/mL — AB (ref <=5.90)

## 2015-08-23 LAB — LIPID PANEL
CHOLESTEROL: 246 mg/dL — AB (ref 0–200)
HDL: 57 mg/dL (ref 35–70)
LDL Cholesterol: 147 mg/dL
Triglycerides: 246 mg/dL — AB (ref 40–160)

## 2015-08-23 LAB — HEMOGLOBIN A1C: Hemoglobin A1C: 8.3

## 2015-08-23 LAB — MICROALBUMIN, URINE: Microalb, Ur: <0.7

## 2015-09-07 ENCOUNTER — Other Ambulatory Visit (HOSPITAL_COMMUNITY)
Admission: RE | Admit: 2015-09-07 | Discharge: 2015-09-07 | Disposition: A | Discharge status: Home / Self Care | Payer: Medicare Other | Source: Ambulatory Visit | Attending: Obstetrics and Gynecology | Admitting: Obstetrics and Gynecology

## 2015-09-07 ENCOUNTER — Other Ambulatory Visit: Payer: Self-pay | Admitting: Obstetrics and Gynecology

## 2015-09-07 DIAGNOSIS — Z01419 Encounter for gynecological examination (general) (routine) without abnormal findings: Secondary | ICD-10-CM | POA: Insufficient documentation

## 2015-09-07 DIAGNOSIS — Z1151 Encounter for screening for human papillomavirus (HPV): Secondary | ICD-10-CM | POA: Insufficient documentation

## 2015-09-08 LAB — CYTOLOGY - PAP

## 2015-09-16 LAB — HM MAMMOGRAPHY

## 2015-09-16 LAB — VARICELLA ZOSTER ANTIBODY, IGG: Varicella zoster IgG: 433

## 2016-01-05 ENCOUNTER — Encounter: Payer: Self-pay | Admitting: Family Medicine

## 2016-01-05 ENCOUNTER — Ambulatory Visit (INDEPENDENT_AMBULATORY_CARE_PROVIDER_SITE_OTHER): Payer: Medicare Other | Admitting: Family Medicine

## 2016-01-05 VITALS — BP 121/64 | HR 83 | Ht 64.25 in | Wt 195.9 lb

## 2016-01-05 DIAGNOSIS — I10 Essential (primary) hypertension: Secondary | ICD-10-CM | POA: Diagnosis not present

## 2016-01-05 DIAGNOSIS — E669 Obesity, unspecified: Secondary | ICD-10-CM

## 2016-01-05 DIAGNOSIS — M159 Polyosteoarthritis, unspecified: Secondary | ICD-10-CM

## 2016-01-05 DIAGNOSIS — E785 Hyperlipidemia, unspecified: Secondary | ICD-10-CM | POA: Diagnosis not present

## 2016-01-05 DIAGNOSIS — E119 Type 2 diabetes mellitus without complications: Secondary | ICD-10-CM

## 2016-01-05 DIAGNOSIS — E559 Vitamin D deficiency, unspecified: Secondary | ICD-10-CM | POA: Insufficient documentation

## 2016-01-05 DIAGNOSIS — Z86010 Personal history of colonic polyps: Secondary | ICD-10-CM

## 2016-01-05 DIAGNOSIS — E539 Vitamin B deficiency, unspecified: Secondary | ICD-10-CM | POA: Insufficient documentation

## 2016-01-05 DIAGNOSIS — N6009 Solitary cyst of unspecified breast: Secondary | ICD-10-CM

## 2016-01-05 DIAGNOSIS — E079 Disorder of thyroid, unspecified: Secondary | ICD-10-CM

## 2016-01-05 HISTORY — DX: Obesity, unspecified: E66.9

## 2016-01-05 HISTORY — DX: Polyosteoarthritis, unspecified: M15.9

## 2016-01-05 LAB — POCT GLYCOSYLATED HEMOGLOBIN (HGB A1C): HEMOGLOBIN A1C: 7.9

## 2016-01-05 NOTE — Assessment & Plan Note (Signed)
Continue losartan. Well controlled.

## 2016-01-05 NOTE — Assessment & Plan Note (Addendum)
Diabetes since 2005. Only takes oral glimepiride. No need for insulin. No sequelae of neurological nature, no chronic kidney disease, no visual or eye problems.  A1c today 7.9.   We will discuss at next office visit

## 2016-01-05 NOTE — Assessment & Plan Note (Signed)
Counseling done on diet and exercise.

## 2016-01-05 NOTE — Assessment & Plan Note (Signed)
q 5 yrs.  Jan 2017 was latest one

## 2016-01-05 NOTE — Assessment & Plan Note (Addendum)
Patient has refused to take Synthroid in the past and thinks she is taking a "natural" med now

## 2016-01-05 NOTE — Patient Instructions (Addendum)
Diabetes Mellitus and Food It is important for you to manage your blood sugar (glucose) level. Your blood glucose level can be greatly affected by what you eat. Eating healthier foods in the appropriate amounts throughout the day at about the same time each day will help you control your blood glucose level. It can also help slow or prevent worsening of your diabetes mellitus. Healthy eating may even help you improve the level of your blood pressure and reach or maintain a healthy weight.  General recommendations for healthful eating and cooking habits include:  Eating meals and snacks regularly. Avoid going long periods of time without eating to lose weight.  Eating a diet that consists mainly of plant-based foods, such as fruits, vegetables, nuts, legumes, and whole grains.  Using low-heat cooking methods, such as baking, instead of high-heat cooking methods, such as deep frying. Work with your dietitian to make sure you understand how to use the Nutrition Facts information on food labels. HOW CAN FOOD AFFECT ME? Carbohydrates Carbohydrates affect your blood glucose level more than any other type of food. Your dietitian will help you determine how many carbohydrates to eat at each meal and teach you how to count carbohydrates. Counting carbohydrates is important to keep your blood glucose at a healthy level, especially if you are using insulin or taking certain medicines for diabetes mellitus. Alcohol Alcohol can cause sudden decreases in blood glucose (hypoglycemia), especially if you use insulin or take certain medicines for diabetes mellitus. Hypoglycemia can be a life-threatening condition. Symptoms of hypoglycemia (sleepiness, dizziness, and disorientation) are similar to symptoms of having too much alcohol.  If your health care provider has given you approval to drink alcohol, do so in moderation and use the following guidelines:  Women should not have more than one drink per day, and men  should not have more than two drinks per day. One drink is equal to:  12 oz of beer.  5 oz of wine.  1 oz of hard liquor.  Do not drink on an empty stomach.  Keep yourself hydrated. Have water, diet soda, or unsweetened iced tea.  Regular soda, juice, and other mixers might contain a lot of carbohydrates and should be counted. WHAT FOODS ARE NOT RECOMMENDED? As you make food choices, it is important to remember that all foods are not the same. Some foods have fewer nutrients per serving than other foods, even though they might have the same number of calories or carbohydrates. It is difficult to get your body what it needs when you eat foods with fewer nutrients. Examples of foods that you should avoid that are high in calories and carbohydrates but low in nutrients include:  Trans fats (most processed foods list trans fats on the Nutrition Facts label).  Regular soda.  Juice.  Candy.  Sweets, such as cake, pie, doughnuts, and cookies.  Fried foods. WHAT FOODS CAN I EAT? Eat nutrient-rich foods, which will nourish your body and keep you healthy. The food you should eat also will depend on several factors, including:  The calories you need.  The medicines you take.  Your weight.  Your blood glucose level.  Your blood pressure level.  Your cholesterol level. You should eat a variety of foods, including:  Protein.  Lean cuts of meat.  Proteins low in saturated fats, such as fish, egg whites, and beans. Avoid processed meats.  Fruits and vegetables.  Fruits and vegetables that may help control blood glucose levels, such as apples, mangoes, and   yams.  Dairy products.  Choose fat-free or low-fat dairy products, such as milk, yogurt, and cheese.  Grains, bread, pasta, and rice.  Choose whole grain products, such as multigrain bread, whole oats, and brown rice. These foods may help control blood pressure.  Fats.  Foods containing healthful fats, such as nuts,  avocado, olive oil, canola oil, and fish. DOES EVERYONE WITH DIABETES MELLITUS HAVE THE SAME MEAL PLAN? Because every person with diabetes mellitus is different, there is not one meal plan that works for everyone. It is very important that you meet with a dietitian who will help you create a meal plan that is just right for you.   This information is not intended to replace advice given to you by your health care provider. Make sure you discuss any questions you have with your health care provider.   Document Released: 02/22/2005 Document Revised: 06/18/2014 Document Reviewed: 04/24/2013 Elsevier Interactive Patient Education 2016 [employer] Diabetes and Exercise Exercising regularly is important. It is not just about losing weight. It has many health benefits, such as:  Improving your overall fitness, flexibility, and endurance.  Increasing your bone density.  Helping with weight control.  Decreasing your body fat.  Increasing your muscle strength.  Reducing stress and tension.  Improving your overall health. People with diabetes who exercise gain additional benefits because exercise:  Reduces appetite.  Improves the body's use of blood sugar (glucose).  Helps lower or control blood glucose.  Decreases blood pressure.  Helps control blood lipids (such as cholesterol and triglycerides).  Improves the body's use of the hormone insulin by:  Increasing the body's insulin sensitivity.  Reducing the body's insulin needs.  Decreases the risk for heart disease because exercising:  Lowers cholesterol and triglycerides levels.  Increases the levels of good cholesterol (such as high-density lipoproteins [HDL]) in the body.  Lowers blood glucose levels. YOUR ACTIVITY PLAN  Choose an activity that you enjoy, and set realistic goals. To exercise safely, you should begin practicing any new physical activity slowly, and gradually increase the intensity of the exercise over  time. Your health care provider or diabetes educator can help create an activity plan that works for you. General recommendations include:  Encouraging children to engage in at least 60 minutes of physical activity each day.  Stretching and performing strength training exercises, such as yoga or weight lifting, at least 2 times per week.  Performing a total of at least 150 minutes of moderate-intensity exercise each week, such as brisk walking or water aerobics.  Exercising at least 3 days per week, making sure you allow no more than 2 consecutive days to pass without exercising.  Avoiding long periods of inactivity (90 minutes or more). When you have to spend an extended period of time sitting down, take frequent breaks to walk or stretch. RECOMMENDATIONS FOR EXERCISING WITH TYPE 1 OR TYPE 2 DIABETES   Check your blood glucose before exercising. If blood glucose levels are greater than 240 mg/dL, check for urine ketones. Do not exercise if ketones are present.  Avoid injecting insulin into areas of the body that are going to be exercised. For example, avoid injecting insulin into:  The arms when playing tennis.  The legs when jogging.  Keep a record of:  Food intake before and after you exercise.  Expected peak times of insulin action.  Blood glucose levels before and after you exercise.  The type and amount of exercise you have done.  Review your records with your   health care provider. Your health care provider will help you to develop guidelines for adjusting food intake and insulin amounts before and after exercising.  If you take insulin or oral hypoglycemic agents, watch for signs and symptoms of hypoglycemia. They include:  Dizziness.  Shaking.  Sweating.  Chills.  Confusion.  Drink plenty of water while you exercise to prevent dehydration or heat stroke. Body water is lost during exercise and must be replaced.  Talk to your health care provider before starting  an exercise program to make sure it is safe for you. Remember, almost any type of activity is better than none.   This information is not intended to replace advice given to you by your health care provider. Make sure you discuss any questions you have with your health care provider.   Document Released: 08/18/2003 Document Revised: 10/12/2014 Document Reviewed: 11/04/2012 Elsevier Interactive Patient Education 2016 Elsevier Inc.  

## 2016-01-05 NOTE — Assessment & Plan Note (Signed)
Patient has a long history of high cholesterol for over 10 years and has always refused going on statins. She is on red yeast rice.  Diet discussed. We will obtain labs and have a follow-up to discuss

## 2016-01-05 NOTE — Progress Notes (Signed)
New patient office visit note:   Assessment\ Plan:   Diabetes mellitus without complication (HCC) Diabetes since 2005. Only takes oral glimepiride. No need for insulin. No sequelae of neurological nature, no chronic kidney disease, no visual or eye problems.  A1c today 7.9.   We will discuss at next office visit  Obesity Counseling done on diet and exercise.  HLD (hyperlipidemia) Patient has a long history of high cholesterol for over 10 years and has always refused going on statins. She is on red yeast rice.  Diet discussed. We will obtain labs and have a follow-up to discuss  Essential hypertension Continue losartan. Well controlled.  Hypothyroidism- on Armour thyroid Patient has refused to take Synthroid in the past and thinks she is taking a "natural" med now  COLONIC POLYPS, HX OF q 5 yrs.  Jan 2017 was latest one  Breast cysts- diffuse- B/L Mammogram 2017 which was normal   Patient's Medications  New Prescriptions   No medications on file  Previous Medications   ALPHA-LIPOIC ACID 200 MG CAPS    Take 400 mg by mouth 2 (two) times daily.   CALCIUM PO    Take by mouth.   CHOLECALCIFEROL (VITAMIN D) 1000 UNITS TABLET    Take 5,000 Units by mouth daily.   CHROMIUM 500 MCG TABS    Take by mouth.   CYANOCOBALAMIN (VITAMIN B 12 PO)    Take by mouth daily.   ESTRADIOL VA    Place vaginally.   GLIMEPIRIDE (AMARYL) 2 MG TABLET    Take 2 mg by mouth 2 (two) times daily.   HYOSCYAMINE (ANASPAZ) 0.125 MG TBDP DISINTERGRATING TABLET    Place 0.125 mg under the tongue as needed.   HYOSCYAMINE (LEVSIN) 0.125 MG/5ML ELIX    Take 0.125 mg by mouth.   LANCETS (ONETOUCH ULTRASOFT) LANCETS       LOSARTAN (COZAAR) 100 MG TABLET    Take 1 tablet by mouth daily.   OMEGA-3 FATTY ACIDS (OMEGA 3 PO)    Take by mouth.   ONE TOUCH ULTRA TEST TEST STRIP       RED YEAST RICE 600 MG CAPS    Take 1 capsule by mouth daily.   THYROID (ARMOUR) 130 MG TABLET    Take 130 mg by mouth daily.    TRIAMCINOLONE (KENALOG) 0.025 % CREAM    Apply 1 application topically as needed.   VITAMIN C (ASCORBIC ACID) 500 MG TABLET    Take 500 mg by mouth daily.   VITAMIN E 400 UNIT CAPSULE    Take 400 Units by mouth daily.   ZINC CITRATE-PHYTASE 25-500 MG CAPS    Take 50-1,000 mg by mouth daily.  Modified Medications   No medications on file  Discontinued Medications   ARMOUR THYROID PO    Take 100 mcg by mouth.   SIMVASTATIN (ZOCOR) 20 MG TABLET    Take 20 mg by mouth every evening.    Return in about 2 weeks (around 01/19/2016) for Fasting blood work, then ov with me 1 weekl ater  to discuss it .  The patient was counseled, risk factors were discussed, anticipatory guidance given.  Gross side effects, risk and benefits, and alternatives of medications discussed with patient.  Patient is aware that all medications have potential side effects and we are unable to predict every side effect or drug-drug interaction that may occur.  Expresses verbal understanding and consents to current therapy plan and treatment regimen.  Please see AVS handed out to patient at the end of our visit for further patient instructions/ counseling done pertaining to today's office visit.    Note: This document was prepared using Dragon voice recognition software and may include unintentional dictation errors.  ----------------------------------------------------------------------------------------------------------------------    Subjective:    Chief Complaint  Patient presents with  . Establish Care    HPI: Sara Mckee is a pleasant 75 y.o. female who presents to [facility] today to review their medical history with me and establish care.   Has GYN- Vinardo.   GI- dr Evette Cristal- takes med for peptic ulcers Opthomologist- Dr Luciana Axe; [place] Patient not sure of others.  We reviewed her multiple medical problems today in full.     Patient Active Problem List   Diagnosis  Date Noted  . Diabetes mellitus without complication (HCC) 01/29/2007    Priority: High  . HLD (hyperlipidemia) 01/29/2007    Priority: High  . Essential hypertension 01/29/2007    Priority: High  . Obesity 01/05/2016    Priority: Medium  . Hypothyroidism- on Armour thyroid     Priority: Medium  . 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 Vitamin B deficiency 01/05/2016  . Vitamin D deficiency 01/05/2016  . Breast cysts- diffuse- B/L   . COLONIC POLYPS, HX OF 01/29/2007  . DIVERTICULITIS, HX OF 01/29/2007     Past Medical History:  Diagnosis Date  . Arthritis   . Breast cyst   . Cervical intraepithelial neoplasia (CIN)    CRYO  . Diabetes mellitus   . Elevated cholesterol   . Frequent urination   . 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  . Hypertension   . Obesity 01/05/2016  . Thyroid disease    HYPOTHYROID     Past Surgical History:  Procedure Laterality Date  . CATARACT EXTRACTION       Family History  Problem Relation Age of Onset  . Hypertension Mother   . Heart disease Mother   . Diabetes Mother   . Cancer Mother     throat  . Hyperlipidemia Mother   . Hypertension Sister   . Hypertension Brother   . Heart disease Brother   . Heart attack Brother   . Diabetes Brother   . Hyperlipidemia Brother   . Stroke Brother   . Diabetes Maternal Grandmother   . Heart disease Maternal Grandmother      History  Drug Use No    History  Alcohol Use No    History  Smoking Status  . Never Smoker  Smokeless Tobacco  . Never Used    Patient's Medications  New Prescriptions   No medications on file  Previous Medications   ALPHA-LIPOIC ACID 200 MG CAPS    Take 400 mg by mouth 2 (two) times daily.   CALCIUM PO    Take by mouth.   CHOLECALCIFEROL (VITAMIN D) 1000 UNITS TABLET    Take 5,000 Units by mouth daily.   CHROMIUM 500 MCG TABS    Take by mouth.   CYANOCOBALAMIN (VITAMIN B 12 PO)    Take by mouth daily.   ESTRADIOL VA    Place  vaginally.   GLIMEPIRIDE (AMARYL) 2 MG TABLET    Take 2 mg by mouth 2 (two) times daily.   HYOSCYAMINE (ANASPAZ) 0.125 MG TBDP DISINTERGRATING TABLET    Place 0.125 mg under the tongue as needed.   HYOSCYAMINE (LEVSIN) 0.125 MG/5ML ELIX  Take 0.125 mg by mouth.   LANCETS (ONETOUCH ULTRASOFT) LANCETS       LOSARTAN (COZAAR) 100 MG TABLET    Take 1 tablet by mouth daily.   OMEGA-3 FATTY ACIDS (OMEGA 3 PO)    Take by mouth.   ONE TOUCH ULTRA TEST TEST STRIP       RED YEAST RICE 600 MG CAPS    Take 1 capsule by mouth daily.   THYROID (ARMOUR) 130 MG TABLET    Take 130 mg by mouth daily.   TRIAMCINOLONE (KENALOG) 0.025 % CREAM    Apply 1 application topically as needed.   VITAMIN C (ASCORBIC ACID) 500 MG TABLET    Take 500 mg by mouth daily.   VITAMIN E 400 UNIT CAPSULE    Take 400 Units by mouth daily.   ZINC CITRATE-PHYTASE 25-500 MG CAPS    Take 50-1,000 mg by mouth daily.  Modified Medications   No medications on file  Discontinued Medications   ARMOUR THYROID PO    Take 100 mcg by mouth.   SIMVASTATIN (ZOCOR) 20 MG TABLET    Take 20 mg by mouth every evening.    Allergies: Latex; Metformin; and Antihistamines, chlorpheniramine-type  Review of Systems:   ( Completed via Adult Medical History Intake form today ) General:  Denies fever, chills, appetite changes, unexplained weight loss.  Optho/Auditory: +visual changes, Denies blurred vision/LOV, ringing in ears/ diff hearing Respiratory:   Denies SOB, DOE, cough, wheezing, + hay fevers\allergies Cardiovascular:   Denies chest pain, palpitations, new onset peripheral edema  Gastrointestinal:   Denies nausea, vomiting, diarrhea.  Genitourinary:    Denies dysuria, increased frequency, flank pain. + Nighttime urination and leaking urine  Endocrine:     Denies hot or cold intolerance, polyuria, polydipsia. Musculoskeletal:  Denies unexplained myalgias, joint swelling, gait problems + chronic arthralgias.  Skin:  Denies rash, suspicious  lesions or new/ changes in moles Neurological:    Denies dizziness, syncope, unexplained weakness, lightheadedness, numbness  Psychiatric/Behavioral:   Denies mood changes, suicidal or homicidal ideations, hallucinations, + sleep problems    Objective:   Blood pressure 121/64, pulse 83, height 5' 4.25" (1.632 m), weight 195 lb 14.4 oz (88.9 kg). Body mass index is 33.36 kg/m.   General: Well Developed, well nourished, and in no acute distress.  Neuro: Alert and oriented x3, extra-ocular muscles intact, sensation grossly intact.  HEENT: Normocephalic, atraumatic, pupils equal round reactive to light, neck supple, no gross masses, no carotid bruits, no JVD apprec Skin: no gross suspicious lesions or rashes  Cardiac: Regular rate and rhythm, no murmurs rubs or gallops.  Respiratory: Essentially clear to auscultation bilaterally. Not using accessory muscles, speaking in full sentences.  Abdominal: Soft, not grossly distended Musculoskeletal: Ambulates w/o diff, FROM * 4 ext.  Vasc: less 2 sec cap RF, warm and pink  Psych:  No HI/SI, judgement and insight good.

## 2016-01-05 NOTE — Assessment & Plan Note (Signed)
Mammogram 2017 which was normal

## 2016-01-18 ENCOUNTER — Other Ambulatory Visit: Payer: Medicare Other

## 2016-01-24 ENCOUNTER — Ambulatory Visit: Payer: Medicare Other | Admitting: Family Medicine

## 2016-02-01 ENCOUNTER — Ambulatory Visit: Payer: Medicare Other | Admitting: Family Medicine

## 2016-02-06 ENCOUNTER — Other Ambulatory Visit (INDEPENDENT_AMBULATORY_CARE_PROVIDER_SITE_OTHER): Payer: Medicare Other

## 2016-02-06 DIAGNOSIS — E785 Hyperlipidemia, unspecified: Secondary | ICD-10-CM

## 2016-02-06 DIAGNOSIS — I10 Essential (primary) hypertension: Secondary | ICD-10-CM

## 2016-02-06 DIAGNOSIS — E119 Type 2 diabetes mellitus without complications: Secondary | ICD-10-CM | POA: Diagnosis not present

## 2016-02-06 DIAGNOSIS — E669 Obesity, unspecified: Secondary | ICD-10-CM

## 2016-02-06 DIAGNOSIS — M159 Polyosteoarthritis, unspecified: Secondary | ICD-10-CM

## 2016-02-07 LAB — COMPLETE METABOLIC PANEL WITH GFR
ALBUMIN: 4 g/dL (ref 3.6–5.1)
ALK PHOS: 54 U/L (ref 33–130)
ALT: 17 U/L (ref 6–29)
AST: 20 U/L (ref 10–35)
BILIRUBIN TOTAL: 0.4 mg/dL (ref 0.2–1.2)
BUN: 10 mg/dL (ref 7–25)
CALCIUM: 9.6 mg/dL (ref 8.6–10.4)
CO2: 26 mmol/L (ref 20–31)
Chloride: 102 mmol/L (ref 98–110)
Creat: 0.53 mg/dL — ABNORMAL LOW (ref 0.60–0.93)
GFR, Est African American: >89 mL/min (ref >=60)
GLUCOSE: 143 mg/dL — AB (ref 65–99)
POTASSIUM: 4.4 mmol/L (ref 3.5–5.3)
SODIUM: 137 mmol/L (ref 135–146)
TOTAL PROTEIN: 6.7 g/dL (ref 6.1–8.1)

## 2016-02-07 LAB — CBC WITH DIFFERENTIAL/PLATELET
BASOS ABS: 0 {cells}/uL (ref 0–200)
Basophils Relative: 0 %
EOS ABS: 240 {cells}/uL (ref 15–500)
EOS PCT: 5 %
HEMATOCRIT: 37.2 % (ref 35.0–45.0)
HEMOGLOBIN: 12.6 g/dL (ref 11.7–15.5)
Lymphocytes Relative: 27 %
Lymphs Abs: 1296 cells/uL (ref 850–3900)
MCH: 29.9 pg (ref 27.0–33.0)
MCHC: 33.9 g/dL (ref 32.0–36.0)
MCV: 88.2 fL (ref 80.0–100.0)
MONO ABS: 672 {cells}/uL (ref 200–950)
MPV: 9.4 fL (ref 7.5–12.5)
Monocytes Relative: 14 %
NEUTROS ABS: 2592 {cells}/uL (ref 1500–7800)
NEUTROS PCT: 54 %
Platelets: 282 10*3/uL (ref 140–400)
RBC: 4.22 MIL/uL (ref 3.80–5.10)
RDW: 13.9 % (ref 11.0–15.0)
WBC: 4.8 10*3/uL (ref 3.8–10.8)

## 2016-02-07 LAB — LIPID PANEL
CHOLESTEROL: 255 mg/dL — AB (ref 125–200)
HDL: 48 mg/dL (ref >=46)
LDL Cholesterol: 163 mg/dL — ABNORMAL HIGH (ref <130)
Total CHOL/HDL Ratio: 5.3 Ratio — ABNORMAL HIGH (ref <=5.0)
Triglycerides: 221 mg/dL — ABNORMAL HIGH (ref <150)
VLDL: 44 mg/dL — ABNORMAL HIGH (ref <30)

## 2016-02-07 LAB — TSH: TSH: 1.35 mIU/L

## 2016-02-07 LAB — VITAMIN D 25 HYDROXY (VIT D DEFICIENCY, FRACTURES): Vit D, 25-Hydroxy: 50 ng/mL (ref 30–100)

## 2016-02-07 LAB — VITAMIN B12: Vitamin B-12: 1291 pg/mL — ABNORMAL HIGH (ref 200–1100)

## 2016-02-15 ENCOUNTER — Ambulatory Visit (INDEPENDENT_AMBULATORY_CARE_PROVIDER_SITE_OTHER): Payer: Medicare Other | Admitting: Family Medicine

## 2016-02-15 ENCOUNTER — Encounter: Payer: Self-pay | Admitting: Family Medicine

## 2016-02-15 VITALS — BP 139/82 | HR 70 | Ht 64.25 in | Wt 192.9 lb

## 2016-02-15 DIAGNOSIS — I10 Essential (primary) hypertension: Secondary | ICD-10-CM

## 2016-02-15 DIAGNOSIS — E119 Type 2 diabetes mellitus without complications: Secondary | ICD-10-CM

## 2016-02-15 DIAGNOSIS — E539 Vitamin B deficiency, unspecified: Secondary | ICD-10-CM

## 2016-02-15 DIAGNOSIS — E079 Disorder of thyroid, unspecified: Secondary | ICD-10-CM | POA: Diagnosis not present

## 2016-02-15 DIAGNOSIS — K589 Irritable bowel syndrome without diarrhea: Secondary | ICD-10-CM | POA: Insufficient documentation

## 2016-02-15 DIAGNOSIS — E785 Hyperlipidemia, unspecified: Secondary | ICD-10-CM

## 2016-02-15 DIAGNOSIS — E669 Obesity, unspecified: Secondary | ICD-10-CM

## 2016-02-15 DIAGNOSIS — E559 Vitamin D deficiency, unspecified: Secondary | ICD-10-CM

## 2016-02-15 MED ORDER — ATORVASTATIN CALCIUM 40 MG PO TABS: 40.0000 mg | ORAL_TABLET | Freq: Every day | ORAL | 0 refills | Status: AC

## 2016-02-15 NOTE — Patient Instructions (Addendum)
Since her B12 is a little elevated, please take your B complex vitamins every other day instead of daily.    -  AHA guidelines for exercise of 150 minutes of moderate intensity aerobic activity per week discussed and encouraged.   Discussed how regular exercise will improve brain function and memory, as well as improve mood, boost immune system and help with weight management, among the other, more well-known effects of exercise such as decreasing risk for hypertension, diabetes, hyperlipidemia etc.   -  The AHA strongly endorses consumption of a diet that contains a variety of foods from all the food categories with an emphasis on fruits and vegetables; fat-free and low-fat dairy products; cereal and grain products; legumes and nuts; and fish, poultry, and or lean meats.   Excessive food intake, especially of foods high in saturated and trans fats, sugar, and salt, should be avoided     Irritable Bowel Syndrome, Adult Irritable bowel syndrome (IBS) is not one specific disease. It is a group of symptoms that affects the organs responsible for digestion (gastrointestinal or GI tract).  To regulate how your GI tract works, your body sends signals back and forth between your intestines and your brain. If you have IBS, there may be a problem with these signals. As a result, your GI tract does not function normally. Your intestines may become more sensitive and overreact to certain things. This is especially true when you eat certain foods or when you are under stress.  There are four types of IBS. These may be determined based on the consistency of your stool:   IBS with diarrhea.   IBS with constipation.   Mixed IBS.   Unsubtyped IBS.  It is important to know which type of IBS you have. Some treatments are more likely to be helpful for certain types of IBS.  CAUSES  The exact cause of IBS is not known. RISK FACTORS You may have a higher risk of IBS if:  You are a woman.  You are  younger than 75 years old.  You have a family history of IBS.  You have mental health problems.  You have had bacterial infection of your GI tract. SIGNS AND SYMPTOMS  Symptoms of IBS vary from person to person. The main symptom is abdominal pain or discomfort. Additional symptoms usually include one or more of the following:   Diarrhea, constipation, or both.   Abdominal swelling or bloating.   Feeling full or sick after eating a small or regular-size meal.   Frequent gas.   Mucus in the stool.   A feeling of having more stool left after a bowel movement.  Symptoms tend to come and go. They may be associated with stress, psychiatric conditions, or nothing at all.  DIAGNOSIS  There is no specific test to diagnose IBS. Your health care provider will make a diagnosis based on a physical exam, medical history, and your symptoms. You may have other tests to rule out other conditions that may be causing your symptoms. These may include:   Blood tests.   X-rays.   CT scan.  Endoscopy and colonoscopy. This is a test in which your GI tract is viewed with a long, thin, flexible tube. TREATMENT There is no cure for IBS, but treatment can help relieve symptoms. IBS treatment often includes:   Changes to your diet, such as:  Eating more fiber.  Avoiding foods that cause symptoms.  Drinking more water.  Eating regular, medium-sized portioned meals.  Medicines.  These may include:  Fiber supplements if you have constipation.  Medicine to control diarrhea (antidiarrheal medicines).  Medicine to help control muscle spasms in your GI tract (antispasmodic medicines).  Medicines to help with any mental health issues, such as antidepressants or tranquilizers.  Therapy.  Talk therapy may help with anxiety, depression, or other mental health issues that can make IBS symptoms worse.  Stress reduction.  Managing your stress can help keep symptoms under control. HOME CARE  INSTRUCTIONS   Take medicines only as directed by your health care provider.  Eat a healthy diet.  Avoid foods and drinks with added sugar.  Include more whole grains, fruits, and vegetables gradually into your diet. This may be especially helpful if you have IBS with constipation.  Avoid any foods and drinks that make your symptoms worse. These may include dairy products and caffeinated or carbonated drinks.  Do not eat large meals.  Drink enough fluid to keep your urine clear or pale yellow.  Exercise regularly. Ask your health care provider for recommendations of good activities for you.  Keep all follow-up visits as directed by your health care provider. This is important. SEEK MEDICAL CARE IF:   You have constant pain.  You have trouble or pain with swallowing.  You have worsening diarrhea. SEEK IMMEDIATE MEDICAL CARE IF:   You have severe and worsening abdominal pain.   You have diarrhea and:   You have a rash, stiff neck, or severe headache.   You are irritable, sleepy, or difficult to awaken.   You are weak, dizzy, or extremely thirsty.   You have bright red blood in your stool or you have black tarry stools.   You have unusual abdominal swelling that is painful.   You vomit continuously.   You vomit blood (hematemesis).   You have both abdominal pain and a fever.    This information is not intended to replace advice given to you by your health care provider. Make sure you discuss any questions you have with your health care provider.   Document Released: 05/28/2005 Document Revised: 06/18/2014 Document Reviewed: 02/12/2014 Elsevier Interactive Patient Education 2016 Elsevier Inc.     Diet for Irritable Bowel Syndrome When you have irritable bowel syndrome (IBS), the foods you eat and your eating habits are very important. IBS may cause various symptoms, such as abdominal pain, constipation, or diarrhea. Choosing the right foods can help  ease discomfort caused by these symptoms. Work with your health care provider and dietitian to find the best eating plan to help control your symptoms. WHAT GENERAL GUIDELINES DO I NEED TO FOLLOW?  Keep a food diary. This will help you identify foods that cause symptoms. Write down:  What you eat and when.  What symptoms you have.  When symptoms occur in relation to your meals.  Avoid foods that cause symptoms. Talk with your dietitian about other ways to get the same nutrients that are in these foods.  Eat more foods that contain fiber. Take a fiber supplement if directed by your dietitian.  Eat your meals slowly, in a relaxed setting.  Aim to eat 5-6 small meals per day. Do not skip meals.  Drink enough fluids to keep your urine clear or pale yellow.  Ask your health care provider if you should take an over-the-counter probiotic during flare-ups to help restore healthy gut bacteria.  If you have cramping or diarrhea, try making your meals low in fat and high in carbohydrates. Examples of carbohydrates are  pasta, rice, whole grain breads and cereals, fruits, and vegetables.  If dairy products cause your symptoms to flare up, try eating less of them. You might be able to handle yogurt better than other dairy products because it contains bacteria that help with digestion. WHAT FOODS ARE NOT RECOMMENDED? The following are some foods and drinks that may worsen your symptoms:  Fatty foods, such as [place] fries.  Milk products, such as cheese or ice cream.  Chocolate.  Alcohol.  Products with caffeine, such as coffee.  Carbonated drinks, such as soda. The items listed above may not be a complete list of foods and beverages to avoid. Contact your dietitian for more information. WHAT FOODS ARE GOOD SOURCES OF FIBER? Your health care provider or dietitian may recommend that you eat more foods that contain fiber. Fiber can help reduce constipation and other IBS symptoms. Add foods  with fiber to your diet a little at a time so that your body can get used to them. Too much fiber at once might cause gas and swelling of your abdomen. The following are some foods that are good sources of fiber:  Apples.  Peaches.  Pears.  Berries.  Figs.  Broccoli (raw).  Cabbage.  Carrots.  Raw peas.  Kidney beans.  Lima beans.  Whole grain bread.  Whole grain cereal. FOR MORE INFORMATION  International Foundation for Functional Gastrointestinal Disorders: www.iffgd.[employer] of Diabetes and Digestive and Kidney Diseases: http://norris-lawson.com/.aspx   This information is not intended to replace advice given to you by your health care provider. Make sure you discuss any questions you have with your health care provider.   Document Released: 08/18/2003 Document Revised: 06/18/2014 Document Reviewed: 08/28/2013 Elsevier Interactive Patient Education 2016 [employer].     Guidelines for a Low Cholesterol, Low Saturated Fat Diet  Fats - Limit total intake of fats and oils. - Avoid butter, stick margarine, shortening, lard, palm and coconut oils. - Limit mayonnaise, salad dressings, gravies and sauces, unless they are homemade with low-fat ingredients. - Limit chocolate. - Choose low-fat and nonfat products, such as low-fat mayonnaise, low-fat or non-hydrogenated peanut butter, low-fat or fat-free salad dressings and nonfat gravy. - Use vegetable oil, such as canola or olive oil. - Look for margarine that does not contain trans fatty acids. - Use nuts in moderate amounts. - Read ingredient labels carefully to determine both amount and type of fat present in foods. Limit saturated and trans fats! - Avoid high-fat processed and convenience foods.  Meats and Meat Alternatives - Choose fish, chicken, [place] and lean meats. - Use dried beans, peas, lentils and tofu. - Limit egg yolks to three  to four per week. - If you eat red meat, limit to no more than three servings per week and choose loin or round cuts. - Avoid fatty meats, such as bacon, sausage, franks, luncheon meats and ribs. - Avoid all organ meats, including liver.  Dairy - Choose nonfat or low-fat milk, yogurt and cottage cheese. - Most cheeses are high in fat. Choose cheeses made from non-fat milk, such as mozzarella and ricotta cheese. - Choose light or fat-free cream cheese and sour cream. - Avoid cream and sauces made with cream.  Fruits and Vegetables - Eat a wide variety of fruits and vegetables. - Use lemon juice, vinegar or "mist" olive oil on vegetables. - Avoid adding sauces, fat or oil to vegetables.  Breads, Cereals and Grains - Choose whole-grain breads, cereals, pastas and rice. - Avoid high-fat  snack foods, such as granola, cookies, pies, pastries, doughnuts and croissants.  Cooking Tips - Avoid deep fried foods. - Trim visible fat off meats and remove skin from poultry before cooking. - Bake, broil, boil, poach or roast poultry, fish and lean meats. - Drain and discard fat that drains out of meat as you cook it. - Add little or no fat to foods. - Use vegetable oil sprays to grease pans for cooking or baking. - Steam vegetables. - Use herbs or no-oil marinades to flavor foods.

## 2016-02-15 NOTE — Assessment & Plan Note (Addendum)
Seen Dr Beverely RisenGanam of GI in May.  Given Levsin at that time "for some pain in colon area."    - Pt has not taken anything for it.   -

## 2016-02-15 NOTE — Assessment & Plan Note (Signed)
lipitor-->  We will start it today and plan to go to high dose within next mo or so.  We will make sure patient tolerates it well before we just go to the 80 mg nightly.

## 2016-02-15 NOTE — Progress Notes (Signed)
Assessment and plan:  1. IBS (irritable bowel syndrome)   2. HLD (hyperlipidemia)   3. Diabetes mellitus without complication ([name])   4. Essential hypertension   5. Obesity   6. Hypothyroidism- on Armour thyroid   7. Vitamin B deficiency   8. Vitamin D deficiency    IBS (irritable bowel syndrome)- same sx as usually gets- nothing new Seen Dr Darrel Hoover of GI in May.  Given Levsin at that time "for some pain and cramping in colon area."   - Pt has not taken anything for her current sx and forgot about this med for her GI distress - Imodium A-D when necessary, and Levsin; avoid all dairy, fatty foods, fried foods- just eat bland for next 1 week or so.  If W or no improvement -->  RTC reck   Hypothyroidism- on Armour thyroid Cont same dose- TSH stable   HLD (hyperlipidemia) lipitor-->  We will start it today and plan to go to high dose within next mo or so.  We will make sure patient tolerates it well before we just go to the 80 mg nightly.   Strongly advised to drink more water,   get regular exercise which she is not doing  prudent diet discussed and counseling performed   Diabetes mellitus without complication ([name]) - Patient was unable to tolerate metformin in the past due to GI side effects.  Patient is very much against changes to her diabetes medications today- esp with her gi sx.    But- A1c is improving- down from over 8.0 earlier in the yr.-  good  - She tells me today she will work intently on [employer] and lifestyl over next 3 months in order to get better control of her sugars.  - Strongly encouraged to check fasting and 2 hour postprandial, as well as whenever she feels bad.  She has been slack on this lately   HTN:  At goal, continue meds   Obese:  Strongly encouraged to continue weight loss efforts through prudent diet and increasing activity. Advised to track foods\ join [employer] and  [employer]- silver sneakers   Vit D- cont supp   b12- cut back on supp to half of current dosage   Thyroid- stable, cont current dose.     Patient's Medications  New Prescriptions   ATORVASTATIN (LIPITOR) 40 MG TABLET    Take 1 tablet (40 mg total) by mouth daily at 6 PM.  Previous Medications   ALPHA-LIPOIC ACID 200 MG CAPS    Take 400 mg by mouth 2 (two) times daily.   CALCIUM PO    Take by mouth.   CHOLECALCIFEROL (VITAMIN D) 1000 UNITS TABLET    Take 5,000 Units by mouth daily.   CHROMIUM 500 MCG TABS    Take by mouth.   CYANOCOBALAMIN (VITAMIN B 12 PO)    Take by mouth daily.   ESTRADIOL VA    Place vaginally.   GLIMEPIRIDE (AMARYL) 2 MG TABLET    Take 2 mg by mouth 2 (two) times daily.   HYOSCYAMINE (ANASPAZ) 0.125 MG TBDP DISINTERGRATING TABLET    Place 0.125 mg under the tongue as needed.   LANCETS (ONETOUCH ULTRASOFT) LANCETS       LOSARTAN (COZAAR) 100 MG TABLET    Take 1 tablet by mouth daily.   MULTIPLE VITAMINS-MINERALS (ICAPS AREDS 2) CAPS    Take 1 capsule by mouth 2 (two) times daily.   OLOPATADINE (PATANOL) 0.1 %  OPHTHALMIC SOLUTION    Place 1 drop into both eyes daily.   OMEGA-3 FATTY ACIDS (OMEGA 3 PO)    Take by mouth.   ONE TOUCH ULTRA TEST TEST STRIP       THYROID (ARMOUR) 130 MG TABLET    Take 130 mg by mouth daily.   TRIAMCINOLONE (KENALOG) 0.025 % CREAM    Apply 1 application topically as needed.   VITAMIN C (ASCORBIC ACID) 500 MG TABLET    Take 500 mg by mouth daily.   VITAMIN E 400 UNIT CAPSULE    Take 400 Units by mouth daily.   ZINC CITRATE-PHYTASE 25-500 MG CAPS    Take 50-1,000 mg by mouth daily.  Modified Medications   No medications on file  Discontinued Medications   HYOSCYAMINE (LEVSIN) 0.125 MG/5ML ELIX    Take 0.125 mg by mouth.   RED YEAST RICE 600 MG CAPS    Take 1 capsule by mouth daily.    Return in about 6 weeks (around 03/28/2016) for Follow-up of starting lipitor for chol; reck LFT's.  Anticipatory guidance and routine counseling done  re: condition, txmnt options and need for follow up. All questions of patient's were answered.   Gross side effects, risk and benefits, and alternatives of medications discussed with patient.  Patient is aware that all medications have potential side effects and we are unable to predict every sideeffect or drug-drug interaction that may occur.  Expresses verbal understanding and consents to current therapy plan and treatment regiment.  Please see AVS handed out to patient at the end of our visit for additional patient instructions/ counseling done pertaining to today's office visit.  Note: This document was prepared using Dragon voice recognition software and may include unintentional dictation errors.   ----------------------------------------------------------------------------------------------------------------------  Subjective:   CC: Sara Mckee is a 75 y.o. female who presents to [place] [facility] today for Review of her recent labs as well as some acute complaints today.   CC: Long history chronic on and off IBS symptoms-treated by GI- for many years.  Patient's post to be on Levsin when necessary but she forgot about using it.  Today, she c/o some loose stools- just in the AM's past 2 wks.  Some Cramping- just in the a.m.'s.  H/o constipation typically but hasn't been a problem lately.     Diabetes since 2005. Only takes oral glimepiride. No need for insulin. No sequelae of neurological nature, no chronic kidney disease, no visual or eye problems.   Lipids:  Patient has a long history of high cholesterol for over 10 years and has always refused going on statins- she fears the s-e.  She is on red yeast rice.  chol now W.     Wt Readings from Last 3 Encounters:  02/15/16 192 lb 14.4 oz (87.5 kg)  01/05/16 195 lb 14.4 oz (88.9 kg)  08/07/11 205 lb (93 kg)   BP Readings from Last 3 Encounters:  02/15/16 139/82  01/05/16 121/64  06/06/15 177/76    Pulse Readings from Last 3 Encounters:  02/15/16 70  01/05/16 83  06/06/15 70     Patient Active Problem List   Diagnosis Date Noted  . Diabetes mellitus without complication ([place]) 34/74/2595    Priority: High  . HLD (hyperlipidemia) 01/29/2007    Priority: High  . Essential hypertension 01/29/2007    Priority: High  . Obesity 01/05/2016    Priority: Medium  . Hypothyroidism- on Armour thyroid  Priority: Medium  . 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 IBS (irritable bowel syndrome) 02/15/2016  . Vitamin B deficiency 01/05/2016  . Vitamin D deficiency 01/05/2016  . Breast cysts- diffuse- B/L   . COLONIC POLYPS, HX OF 01/29/2007  . DIVERTICULITIS, HX OF 01/29/2007    Past Medical History:  Diagnosis Date  . Arthritis   . Breast cyst   . Cervical intraepithelial neoplasia (CIN)    CRYO  . Diabetes mellitus   . Elevated cholesterol   . Frequent urination   . 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  . Hypertension   . Obesity 01/05/2016  . Thyroid disease    HYPOTHYROID    Past Surgical History:  Procedure Laterality Date  . CATARACT EXTRACTION      Social History  Substance Use Topics  . Smoking status: Never Smoker  . Smokeless tobacco: Never Used  . Alcohol use No    family history includes Cancer in her mother; Diabetes in her brother, maternal grandmother, and mother; Heart attack in her brother; Heart disease in her brother, maternal grandmother, and mother; Hyperlipidemia in her brother and mother; Hypertension in her brother, mother, and sister; Stroke in her brother.   Medications: Current Outpatient Prescriptions  Medication Sig Dispense Refill  . Alpha-Lipoic Acid 200 MG CAPS Take 400 mg by mouth 2 (two) times daily.    Marland Kitchen CALCIUM PO Take by mouth.    . cholecalciferol (VITAMIN D) 1000 UNITS tablet Take 5,000 Units by mouth daily.    . Chromium 500 MCG TABS Take by mouth.    . Cyanocobalamin (VITAMIN B 12 PO) Take by mouth  daily.    Marland Kitchen ESTRADIOL VA Place vaginally.    Marland Kitchen glimepiride (AMARYL) 2 MG tablet Take 2 mg by mouth 2 (two) times daily.    . hyoscyamine (ANASPAZ) 0.125 MG TBDP disintergrating tablet Place 0.125 mg under the tongue as needed.    . Lancets (ONETOUCH ULTRASOFT) lancets     . losartan (COZAAR) 100 MG tablet Take 1 tablet by mouth daily.    . Multiple Vitamins-Minerals (ICAPS AREDS 2) CAPS Take 1 capsule by mouth 2 (two) times daily.    Marland Kitchen olopatadine (PATANOL) 0.1 % ophthalmic solution Place 1 drop into both eyes daily.    . Omega-3 Fatty Acids (OMEGA 3 PO) Take by mouth.    . ONE TOUCH ULTRA TEST test strip     . thyroid (ARMOUR) 130 MG tablet Take 130 mg by mouth daily.    Marland Kitchen triamcinolone (KENALOG) 0.025 % cream Apply 1 application topically as needed.    . vitamin C (ASCORBIC ACID) 500 MG tablet Take 500 mg by mouth daily.    . vitamin E 400 UNIT capsule Take 400 Units by mouth daily.    . Zinc Citrate-Phytase 25-500 MG CAPS Take 50-1,000 mg by mouth daily.    Marland Kitchen atorvastatin (LIPITOR) 40 MG tablet Take 1 tablet (40 mg total) by mouth daily at 6 PM. 90 tablet 0   No current facility-administered medications for this visit.     Allergies:  Allergies  Allergen Reactions  . Latex Hives  . Metformin Other (See Comments)    Nausea, diarrhea, stomach cramps  . Antihistamines, Chlorpheniramine-Type Palpitations     ROS:  Const:    no fevers, chills Eyes:    conjunctiva clear, no vision changes or blurred vision ENT:  no hearing difficulties, no dysphagia, no dysphonia, no nose bleeds CV:   no  chest pain, arrhythmias, no orthopnea, no PND Pulm:   no SOB at rest or exertion, no Wheeze, no DIB, no hemoptysis GI:    no N/V//C, no abd pain GU:   no blood in urine or inc freq or urgency Heme/Onc:    no unexplained bleeding, no night sweats, no more fatigue than usual Neuro:   No dizziness, no LOC, No unexplained weakness or numbness Endo:   no unexplained wt loss or gain M-Sk:   no  localized myalgias or arthralgias Psych:    No SI/HI, no memory prob or unexplained confusion    Objective:  Blood pressure 139/82, pulse 70, height 5' 4.25" (1.632 m), weight 192 lb 14.4 oz (87.5 kg). Body mass index is 32.85 kg/m.  Gen:   Well,  NAD,   A and O *3 HEENT:    [place]/AT, EOMI,  MMM, OP- clr Lungs:   Normal work of breathing. CTA B/L, no Wh, rhonchi Heart:   RRR, S1, S2 WNL's, no MRG Abd:   Soft, mild distention, no G/R/R, no OM, + BS *4. Exts:    warm, pink,  Brisk capillary refill, warm and well perfused.  Psych:    No HI/SI, judgement and insight good, Euthymic mood. Full Affect.   Recent Results (from the past 2160 hour(s))  POCT glycosylated hemoglobin (Hb A1C)     Status: Abnormal   Collection Time: 01/05/16  3:19 PM  Result Value Ref Range   Hemoglobin A1C 7.9   ( 8.3 prior  08/23/15)   CBC with Differential/Platelet     Status: None   Collection Time: 02/06/16  8:49 AM  Result Value Ref Range   WBC 4.8 3.8 - 10.8 K/uL   RBC 4.22 3.80 - 5.10 MIL/uL   Hemoglobin 12.6 11.7 - 15.5 g/dL   HCT 37.2 35.0 - 45.0 %   MCV 88.2 80.0 - 100.0 fL   MCH 29.9 27.0 - 33.0 pg   MCHC 33.9 32.0 - 36.0 g/dL   RDW 13.9 11.0 - 15.0 %   Platelets 282 140 - 400 K/uL   MPV 9.4 7.5 - 12.5 fL   Neutro Abs 2,592 1,500 - 7,800 cells/uL   Lymphs Abs 1,296 850 - 3,900 cells/uL   Monocytes Absolute 672 200 - 950 cells/uL   Eosinophils Absolute 240 15 - 500 cells/uL   Basophils Absolute 0 0 - 200 cells/uL   Neutrophils Relative % 54 %   Lymphocytes Relative 27 %   Monocytes Relative 14 %   Eosinophils Relative 5 %   Basophils Relative 0 %   Smear Review Criteria for review not met   COMPLETE METABOLIC PANEL WITH GFR     Status: Abnormal   Collection Time: 02/06/16  8:49 AM  Result Value Ref Range   Sodium 137 135 - 146 mmol/L   Potassium 4.4 3.5 - 5.3 mmol/L   Chloride 102 98 - 110 mmol/L   CO2 26 20 - 31 mmol/L   Glucose, Bld 143 (H) 65 - 99 mg/dL   BUN 10 7 - 25 mg/dL    Creat 0.53 (L) 0.60 - 0.93 mg/dL    Comment:   For patients > or = 75 years of age: The upper reference limit for Creatinine is approximately 13% higher for people identified as African-American.      Total Bilirubin 0.4 0.2 - 1.2 mg/dL   Alkaline Phosphatase 54 33 - 130 U/L   AST 20 10 - 35 U/L   ALT 17 6 -  29 U/L   Total Protein 6.7 6.1 - 8.1 g/dL   Albumin 4.0 3.6 - 5.1 g/dL   Calcium 9.6 8.6 - 10.4 mg/dL   GFR, Est African American >89 >=60 mL/min   GFR, Est Non African American >89 >=60 mL/min  Lipid panel     Status: Abnormal   Collection Time: 02/06/16  8:49 AM  Result Value Ref Range   Cholesterol 255 (H) 125 - 200 mg/dL   Triglycerides 221 (H) <150 mg/dL   HDL 48 >=46 mg/dL   Total CHOL/HDL Ratio 5.3 (H) <=5.0 Ratio   VLDL 44 (H) <30 mg/dL   LDL Cholesterol 163 (H) <130 mg/dL    Comment:   Total Cholesterol/HDL Ratio:CHD Risk                        Coronary Heart Disease Risk Table                                        Men       Women          1/2 Average Risk              3.4        3.3              Average Risk              5.0        4.4           2X Average Risk              9.6        7.1           3X Average Risk             23.4       11.0 Use the calculated Patient Ratio above and the CHD Risk table  to determine the patient's CHD Risk.   Vitamin B12     Status: Abnormal   Collection Time: 02/06/16  8:49 AM  Result Value Ref Range   Vitamin B-12 1,291 (H) 200 - 1,100 pg/mL  VITAMIN D 25 Hydroxy (Vit-D Deficiency, Fractures)     Status: None   Collection Time: 02/06/16  8:49 AM  Result Value Ref Range   Vit D, 25-Hydroxy 50 30 - 100 ng/mL    Comment: Vitamin D Status           25-OH Vitamin D        Deficiency                <20 ng/mL        Insufficiency         20 - 29 ng/mL        Optimal             > or = 30 ng/mL   For 25-OH Vitamin D testing on patients on D2-supplementation and patients for whom quantitation of D2 and D3 fractions is  required, the QuestAssureD 25-OH VIT D, (D2,D3), LC/MS/MS is recommended: order code [id number] (patients > 2 yrs).   TSH     Status: None   Collection Time: 02/06/16  8:49 AM  Result Value Ref Range   TSH 1.35 mIU/L    Comment:   Reference Range   > or = 20 Years  0.40-4.50  Pregnancy Range First trimester  0.26-2.66 Second trimester 0.55-2.73 Third trimester  0.43-2.91

## 2016-02-15 NOTE — Assessment & Plan Note (Signed)
Cont same dose- TSH stable

## 2016-02-16 NOTE — Assessment & Plan Note (Signed)
Patient was unable to tolerate metformin in the past due to GI side effects.  - Patient is very much against changes to her diabetes medications today- esp with her gi sx.   - She tells me today she will work intently on [employer] diet and lifestyl over next 3 months in order to get better control of her sugars.  - Strongly encouraged to check fasting and 2 hour postprandial as well as whenever she feels bad

## 2016-03-01 ENCOUNTER — Other Ambulatory Visit: Payer: Self-pay

## 2016-03-01 MED ORDER — THYROID 130 MG PO TABS: 130.0000 mg | ORAL_TABLET | Freq: Every day | ORAL | 0 refills | Status: DC

## 2016-03-12 DIAGNOSIS — G4733 Obstructive sleep apnea (adult) (pediatric): Secondary | ICD-10-CM | POA: Diagnosis not present

## 2016-03-14 ENCOUNTER — Ambulatory Visit: Payer: Medicare Other

## 2016-03-21 ENCOUNTER — Other Ambulatory Visit: Payer: Self-pay

## 2016-03-21 MED ORDER — GLIMEPIRIDE 2 MG PO TABS: 2.0000 mg | ORAL_TABLET | Freq: Two times a day (BID) | ORAL | 0 refills | Status: AC

## 2016-03-28 ENCOUNTER — Encounter: Payer: Self-pay | Admitting: Family Medicine

## 2016-03-28 ENCOUNTER — Ambulatory Visit (INDEPENDENT_AMBULATORY_CARE_PROVIDER_SITE_OTHER): Payer: Medicare Other | Admitting: Family Medicine

## 2016-03-28 VITALS — BP 164/68 | HR 69 | Ht 64.25 in | Wt 189.5 lb

## 2016-03-28 DIAGNOSIS — E782 Mixed hyperlipidemia: Secondary | ICD-10-CM | POA: Diagnosis not present

## 2016-03-28 DIAGNOSIS — E079 Disorder of thyroid, unspecified: Secondary | ICD-10-CM | POA: Diagnosis not present

## 2016-03-28 DIAGNOSIS — Z79899 Other long term (current) drug therapy: Secondary | ICD-10-CM

## 2016-03-28 DIAGNOSIS — Z23 Encounter for immunization: Secondary | ICD-10-CM | POA: Diagnosis not present

## 2016-03-28 DIAGNOSIS — E669 Obesity, unspecified: Secondary | ICD-10-CM

## 2016-03-28 DIAGNOSIS — I10 Essential (primary) hypertension: Secondary | ICD-10-CM

## 2016-03-28 DIAGNOSIS — E119 Type 2 diabetes mellitus without complications: Secondary | ICD-10-CM

## 2016-03-28 MED ORDER — THYROID 120 MG PO TABS: 120.0000 mg | ORAL_TABLET | Freq: Every day | ORAL | 0 refills | Status: AC

## 2016-03-28 MED ORDER — THYROID 120 MG PO TABS: 120.0000 mg | ORAL_TABLET | Freq: Every day | ORAL | 0 refills | Status: DC

## 2016-03-28 NOTE — Progress Notes (Signed)
Impression and Recommendations:    1. Mixed hyperlipidemia   2. Hypothyroidism- on Armour thyroid   3. Obesity, Class I, BMI 30-34.9   4. Essential hypertension   5. Encounter for long-term (current) use of high-risk medication   6. Diabetes mellitus without complication   7. Need for prophylactic vaccination and inoculation against influenza     Diabetes mellitus without complication (HCC) -   Glimepiride daily  --  Please check your blood sugars at home, keep a log and bring in next office visit.  -- Come in in 1-2 weeks for a lab only visit for an A1c, and urine microalbumin if it has not been done this year.    Will also need a diabetic eye exam near future. Told patient to make one.   HLD (hyperlipidemia) Continue Lipitor daily. Tolerating well.  We'll check LFTs today.  Reminded patient of diet and lifestyle modifications   Essential hypertension Patient forgot to take BP medicine today.  - Monitor at home. Write it down and keep a log.  - If remains elevated above goal of under 140/90 regularly, return to clinic sooner than planned  Hypothyroidism- on Armour thyroid Please let me know about how to order the thyroid medicines exactly when she speak with your pharmacist.  Please have them call us.     Orders Placed This Encounter  Procedures  . Flu vaccine HIGH DOSE PF (Fluzone High dose)  . Hepatic function panel     New Prescriptions   THYROID (NP THYROID) 120 MG TABLET    Take 1 tablet (120 mg total) by mouth daily before breakfast.    Modified Medications   No medications on file    Discontinued Medications   THYROID (ARMOUR) 130 MG TABLET    Take 1 tablet (130 mg total) by mouth daily.    The patient was counseled, risk factors were discussed, anticipatory guidance given.  Gross side effects, risk and benefits, and alternatives of medications and treatment plan in general discussed with patient.  Patient is aware that all  medications have potential side effects and we are unable to predict every side effect or drug-drug interaction that may occur.   Patient will call with any questions prior to using medication if they have concerns.  Expresses verbal understanding and consents to current therapy and treatment regimen.  No barriers to understanding were identified.  Red flag symptoms and signs discussed in detail.  Patient expressed understanding regarding what to do in case of emergency\urgent symptoms  Return for 1-2 weeks for A1c lab only, and in 3 months follow-up.  Please see AVS handed out to patient at the end of our visit for further patient instructions/ counseling done pertaining to today's office visit.    Note: This document was prepared using Dragon voice recognition software and may include unintentional dictation errors.   --------------------------------------------------------------------------------------------------------------------------------------------------------------------------------------------------------------------------------------------    Subjective:    CC:  Chief Complaint  Patient presents with  . Hyperlipidemia  . Hypothyroidism    HPI: Sara Mckee is a 75 y.o. female who presents to [facility] today for issues as discussed below.   CHol:   Started lipitor last OV tol well no s-e. Takes as prescribed.   Thyroid- armour is difficult to get - pt will talk to [facility]--> and get back to [place] if they have what she needs. 8 tabs left.     Wt Readings from Last 3 Encounters:  03/28/16 189 lb 8 oz (86 kg)  02/15/16 192 lb 14.4 oz (87.5 kg)  01/05/16 195 lb 14.4 oz (88.9 kg)   BP Readings from Last 3 Encounters:  03/28/16 (!) 164/68  02/15/16 139/82  01/05/16 121/64   Pulse Readings from Last 3 Encounters:  03/28/16 69  02/15/16 70  01/05/16 83   BMI Readings from Last 3 Encounters:  03/28/16 32.27 kg/m  02/15/16  32.85 kg/m  01/05/16 33.36 kg/m     Patient Care Team    Relationship Specialty Notifications Start End  Thomasene Loteborah Angelmarie Ponzo, DO PCP - General Family Medicine  01/05/16   Graylin ShiverSalem F Ganem, MD Consulting Physician Gastroenterology  01/05/16   [facility]  Obstetrics and Gynecology  01/05/16   Edmon CrapeGary A Rankin, MD Consulting Physician Ophthalmology  02/16/16      Patient Active Problem List   Diagnosis Date Noted  . Obesity, Class I, BMI 30-34.9 04/15/2016    Priority: High  . Diabetes mellitus without complication (HCC) 01/29/2007    Priority: High  . HLD (hyperlipidemia) 01/29/2007    Priority: High  . Essential hypertension 01/29/2007    Priority: High  . Hypothyroidism- on Armour thyroid     Priority: Medium  . 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 Encounter for long-term (current) use of high-risk medication 04/15/2016  . IBS (irritable bowel syndrome) 02/15/2016  . Vitamin B deficiency 01/05/2016  . Vitamin D deficiency 01/05/2016  . Breast cysts- diffuse- B/L   . COLONIC POLYPS, HX OF 01/29/2007  . DIVERTICULITIS, HX OF 01/29/2007    Past Medical history, Surgical history, Family history, Social history, Allergies and Medications have been entered into the medical record, reviewed and changed as needed.   Allergies:  Allergies  Allergen Reactions  . Latex Hives  . Metformin Other (See Comments)    Nausea, diarrhea, stomach cramps  . Antihistamines, Chlorpheniramine-Type Palpitations    Review of Systems  Constitutional: Negative.  Negative for chills, diaphoresis, fever, malaise/fatigue and weight loss.  HENT: Negative.  Negative for congestion, sore throat and tinnitus.   Eyes: Negative.  Negative for blurred vision, double vision and photophobia.  Respiratory: Negative.  Negative for cough and wheezing.   Cardiovascular: Negative.  Negative for chest pain and palpitations.  Gastrointestinal: Negative.  Negative for blood in stool, diarrhea,  nausea and vomiting.  Genitourinary: Negative.  Negative for dysuria, frequency and urgency.  Musculoskeletal: Positive for myalgias. Negative for joint pain.  Skin: Negative.  Negative for itching and rash.  Neurological: Negative.  Negative for dizziness, focal weakness, weakness and headaches.  Endo/Heme/Allergies: Negative.  Negative for environmental allergies and polydipsia. Does not bruise/bleed easily.  Psychiatric/Behavioral: Negative.  Negative for depression and memory loss. The patient is not nervous/anxious and does not have insomnia.      Objective:   Blood pressure (!) 164/68, pulse 69, height 5' 4.25" (1.632 m), weight 189 lb 8 oz (86 kg). Body mass index is 32.27 kg/m. General: Well Developed, well nourished, appropriate for stated age.  Neuro: Alert and oriented x3, extra-ocular muscles intact, sensation grossly intact.  HEENT: Normocephalic, atraumatic, neck supple   Skin: Warm and dry, no gross rash. Cardiac: RRR, S1 S2,  no murmurs rubs or gallops.  Respiratory: ECTA B/L, Not using accessory muscles, speaking in full sentences-unlabored. Vascular:  No gross lower ext edema, cap RF less 2 sec. Psych: No HI/SI, judgement and insight good, Euthymic mood. Full Affect.

## 2016-03-28 NOTE — Patient Instructions (Addendum)
Please check your blood sugars at home, keep a log and bring in next office visit.  Common in 1-2 weeks for a lab only visit for an A1c, and urine microalbumin if it has not been done this year.  Please let me know about the thyroid I right medicines when she speak with her pharmacist. Please have them call us.     Diet for Irritable Bowel Syndrome When you have irritable bowel syndrome (IBS), the foods you eat and your eating habits are very important. IBS may cause various symptoms, such as abdominal pain, constipation, or diarrhea. Choosing the right foods can help ease discomfort caused by these symptoms. Work with your health care provider and dietitian to find the best eating plan to help control your symptoms. WHAT GENERAL GUIDELINES DO I NEED TO FOLLOW?  Keep a food diary. This will help you identify foods that cause symptoms. Write down:  What you eat and when.  What symptoms you have.  When symptoms occur in relation to your meals.  Avoid foods that cause symptoms. Talk with your dietitian about other ways to get the same nutrients that are in these foods.  Eat more foods that contain fiber. Take a fiber supplement if directed by your dietitian.  Eat your meals slowly, in a relaxed setting.  Aim to eat 5-6 small meals per day. Do not skip meals.  Drink enough fluids to keep your urine clear or pale yellow.  Ask your health care provider if you should take an over-the-counter probiotic during flare-ups to help restore healthy gut bacteria.  If you have cramping or diarrhea, try making your meals low in fat and high in carbohydrates. Examples of carbohydrates are pasta, rice, whole grain breads and cereals, fruits, and vegetables.  If dairy products cause your symptoms to flare up, try eating less of them. You might be able to handle yogurt better than other dairy products because it contains bacteria that help with digestion. WHAT FOODS ARE NOT RECOMMENDED? The  following are some foods and drinks that may worsen your symptoms:  Fatty foods, such as [place] fries.  Milk products, such as cheese or ice cream.  Chocolate.  Alcohol.  Products with caffeine, such as coffee.  Carbonated drinks, such as soda. The items listed above may not be a complete list of foods and beverages to avoid. Contact your dietitian for more information. WHAT FOODS ARE GOOD SOURCES OF FIBER? Your health care provider or dietitian may recommend that you eat more foods that contain fiber. Fiber can help reduce constipation and other IBS symptoms. Add foods with fiber to your diet a little at a time so that your body can get used to them. Too much fiber at once might cause gas and swelling of your abdomen. The following are some foods that are good sources of fiber:  Apples.  Peaches.  Pears.  Berries.  Figs.  Broccoli (raw).  Cabbage.  Carrots.  Raw peas.  Kidney beans.  Lima beans.  Whole grain bread.  Whole grain cereal. FOR MORE INFORMATION  International Foundation for Functional Gastrointestinal Disorders: www.iffgd.[employer] and [employer]estive [employer]ney [employer]: http://norris-lawson.com/.aspx   This information is not intended to replace advice given to you by your health care provider. Make sure you discuss any questions you have with your health care provider.   Document Released: 08/18/2003 Document Revised: 06/18/2014 Document Reviewed: 08/28/2013 Elsevier Interactive Patient Education 2016 [employer] Irritable Bowel Syndrome, Adult Irritable bowel syndrome (IBS) is  not one specific disease. It is a group of symptoms that affects the organs responsible for digestion (gastrointestinal or GI tract).  To regulate how your GI tract works, your body sends signals back and forth between your intestines and your brain. If you have IBS, there may be a problem with  these signals. As a result, your GI tract does not function normally. Your intestines may become more sensitive and overreact to certain things. This is especially true when you eat certain foods or when you are under stress.  There are four types of IBS. These may be determined based on the consistency of your stool:   IBS with diarrhea.   IBS with constipation.   Mixed IBS.   Unsubtyped IBS.  It is important to know which type of IBS you have. Some treatments are more likely to be helpful for certain types of IBS.  CAUSES  The exact cause of IBS is not known. RISK FACTORS You may have a higher risk of IBS if:  You are a woman.  You are younger than 75 years old.  You have a family history of IBS.  You have mental health problems.  You have had bacterial infection of your GI tract. SIGNS AND SYMPTOMS  Symptoms of IBS vary from person to person. The main symptom is abdominal pain or discomfort. Additional symptoms usually include one or more of the following:   Diarrhea, constipation, or both.   Abdominal swelling or bloating.   Feeling full or sick after eating a small or regular-size meal.   Frequent gas.   Mucus in the stool.   A feeling of having more stool left after a bowel movement.  Symptoms tend to come and go. They may be associated with stress, psychiatric conditions, or nothing at all.  DIAGNOSIS  There is no specific test to diagnose IBS. Your health care provider will make a diagnosis based on a physical exam, medical history, and your symptoms. You may have other tests to rule out other conditions that may be causing your symptoms. These may include:   Blood tests.   X-rays.   CT scan.  Endoscopy and colonoscopy. This is a test in which your GI tract is viewed with a long, thin, flexible tube. TREATMENT There is no cure for IBS, but treatment can help relieve symptoms. IBS treatment often includes:   Changes to your diet, such  as:  Eating more fiber.  Avoiding foods that cause symptoms.  Drinking more water.  Eating regular, medium-sized portioned meals.  Medicines. These may include:  Fiber supplements if you have constipation.  Medicine to control diarrhea (antidiarrheal medicines).  Medicine to help control muscle spasms in your GI tract (antispasmodic medicines).  Medicines to help with any mental health issues, such as antidepressants or tranquilizers.  Therapy.  Talk therapy may help with anxiety, depression, or other mental health issues that can make IBS symptoms worse.  Stress reduction.  Managing your stress can help keep symptoms under control. HOME CARE INSTRUCTIONS   Take medicines only as directed by your health care provider.  Eat a healthy diet.  Avoid foods and drinks with added sugar.  Include more whole grains, fruits, and vegetables gradually into your diet. This may be especially helpful if you have IBS with constipation.  Avoid any foods and drinks that make your symptoms worse. These may include dairy products and caffeinated or carbonated drinks.  Do not eat large meals.  Drink enough fluid to keep your urine  clear or pale yellow.  Exercise regularly. Ask your health care provider for recommendations of good activities for you.  Keep all follow-up visits as directed by your health care provider. This is important. SEEK MEDICAL CARE IF:   You have constant pain.  You have trouble or pain with swallowing.  You have worsening diarrhea. SEEK IMMEDIATE MEDICAL CARE IF:   You have severe and worsening abdominal pain.   You have diarrhea and:   You have a rash, stiff neck, or severe headache.   You are irritable, sleepy, or difficult to awaken.   You are weak, dizzy, or extremely thirsty.   You have bright red blood in your stool or you have black tarry stools.   You have unusual abdominal swelling that is painful.   You vomit continuously.    You vomit blood (hematemesis).   You have both abdominal pain and a fever.    This information is not intended to replace advice given to you by your health care provider. Make sure you discuss any questions you have with your health care provider.   Document Released: 05/28/2005 Document Revised: 06/18/2014 Document Reviewed: 02/12/2014 Elsevier Interactive Patient Education [employer]

## 2016-03-29 LAB — HEPATIC FUNCTION PANEL
ALT: 18 U/L (ref 6–29)
AST: 19 U/L (ref 10–35)
Albumin: 4.2 g/dL (ref 3.6–5.1)
Alkaline Phosphatase: 57 U/L (ref 33–130)
BILIRUBIN DIRECT: 0.1 mg/dL (ref <=0.2)
BILIRUBIN TOTAL: 0.3 mg/dL (ref 0.2–1.2)
Indirect Bilirubin: 0.2 mg/dL (ref 0.2–1.2)
Total Protein: 6.5 g/dL (ref 6.1–8.1)

## 2016-04-04 ENCOUNTER — Other Ambulatory Visit (INDEPENDENT_AMBULATORY_CARE_PROVIDER_SITE_OTHER): Payer: Medicare Other

## 2016-04-04 ENCOUNTER — Other Ambulatory Visit: Payer: Medicare Other

## 2016-04-04 DIAGNOSIS — R739 Hyperglycemia, unspecified: Secondary | ICD-10-CM | POA: Diagnosis not present

## 2016-04-04 LAB — POCT GLYCOSYLATED HEMOGLOBIN (HGB A1C)

## 2016-04-15 DIAGNOSIS — Z79899 Other long term (current) drug therapy: Secondary | ICD-10-CM | POA: Insufficient documentation

## 2016-04-15 DIAGNOSIS — E66811 Obesity, class 1: Secondary | ICD-10-CM | POA: Insufficient documentation

## 2016-04-15 DIAGNOSIS — E669 Obesity, unspecified: Secondary | ICD-10-CM | POA: Insufficient documentation

## 2016-04-15 NOTE — Assessment & Plan Note (Signed)
-     Glimepiride daily  --  Please check your blood sugars at home, keep a log and bring in next office visit.  -- Come in in 1-2 weeks for a lab only visit for an A1c, and urine microalbumin if it has not been done this year.    Will also need a diabetic eye exam near future. Told patient to make one.

## 2016-04-15 NOTE — Assessment & Plan Note (Signed)
Patient forgot to take BP medicine today.  - Monitor at home. Write it down and keep a log.  - If remains elevated above goal of under 140/90 regularly, return to clinic sooner than planned

## 2016-04-15 NOTE — Assessment & Plan Note (Signed)
Continue Lipitor daily. Tolerating well.  We'll check LFTs today.  Reminded patient of diet and lifestyle modifications

## 2016-04-15 NOTE — Assessment & Plan Note (Signed)
Please let me know about how to order the thyroid medicines exactly when she speak with your pharmacist.  Please have them call us.

## 2016-05-08 ENCOUNTER — Ambulatory Visit: Payer: Medicare Other | Admitting: Family Medicine

## 2016-05-09 ENCOUNTER — Ambulatory Visit: Payer: Medicare Other | Admitting: Family Medicine

## 2016-05-11 DEATH — deceased
